# Patient Record
Sex: Male | Born: 2000
Health system: Southern US, Community
[De-identification: ages and names within clinical notes are randomized; demographics above are authoritative.]

## PROBLEM LIST (undated history)

## (undated) DIAGNOSIS — F909 Attention-deficit hyperactivity disorder, unspecified type: Secondary | ICD-10-CM

## (undated) HISTORY — DX: Attention-deficit hyperactivity disorder, unspecified type: F90.9

---
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
## 2020-01-23 ENCOUNTER — Institutional Professional Consult (permissible substitution): Payer: Medicaid Other | Admitting: Family

## 2020-03-03 ENCOUNTER — Telehealth: Payer: Self-pay | Admitting: Family

## 2020-03-03 MED ORDER — SERTRALINE HCL 25 MG PO TABS: ORAL_TABLET | ORAL | 0 refills | Status: DC

## 2020-03-03 NOTE — Telephone Encounter (Signed)
Zoloft 25 mg daily for 7 days then increase to 2 daily for 28 days, # 60 with no RF's.RX for above e-scribed and sent to pharmacy on record  [id number] [place], [place] - [address] AT [facility] [address] [facility] [address] [place] [address] Phone: [phone] Fax: [phone]

## 2020-03-18 ENCOUNTER — Other Ambulatory Visit: Payer: Self-pay

## 2020-03-18 ENCOUNTER — Ambulatory Visit (INDEPENDENT_AMBULATORY_CARE_PROVIDER_SITE_OTHER): Payer: Medicaid Other | Admitting: Family

## 2020-03-18 ENCOUNTER — Encounter: Payer: Self-pay | Admitting: Family

## 2020-03-18 VITALS — BP 108/64 | HR 72 | Resp 16 | Ht 69.09 in | Wt 203.8 lb

## 2020-03-18 DIAGNOSIS — Z7189 Other specified counseling: Secondary | ICD-10-CM

## 2020-03-18 DIAGNOSIS — Z719 Counseling, unspecified: Secondary | ICD-10-CM

## 2020-03-18 DIAGNOSIS — Z79899 Other long term (current) drug therapy: Secondary | ICD-10-CM | POA: Diagnosis not present

## 2020-03-18 DIAGNOSIS — F902 Attention-deficit hyperactivity disorder, combined type: Secondary | ICD-10-CM

## 2020-03-18 DIAGNOSIS — R278 Other lack of coordination: Secondary | ICD-10-CM

## 2020-03-18 NOTE — Progress Notes (Signed)
[facility] [address] [place] [address] Dept: [phone] Dept Fax: [phone] Loc: [phone] Loc Fax: [phone]  Medication Check  Patient ID: Randy Henry, male  DOB: 07-May-2001, 19 y.o.  MRN: [id number]  Date of Evaluation: 03/18/2020 PCP: [name], MD  Accompanied by: Self Patient Lives with: roommate  HISTORY/CURRENT STATUS: HPI Patient here by himself today for the visit. Patient interactive and appropriate with provider today. Patient working full-time and EMT classes in the evening time. Patient had reaction to the COVID-19 vaccine with need for prednisone after 3 weeks. Patient now on 2nd week on Zoloft 25 mg 2 daily with no side effects.   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 :Yes, recent allergic reaction to COVID-19 vaccine. Prednisone for reaction and healing areas with continued issues that remain.   Allergies: Shellfish-derived products  Current Medications: Current Outpatient Medications  Medication Instructions  . Amphetamine Sulfate (EVEKEO) 10-20 mg, Oral, Daily  . EPINEPHrine 0.3 mg/0.3 mL IJ SOAJ injection INJECT INTRAMUSCULARLY AS DIRECTED  . levocetirizine (XYZAL) 5 MG tablet TAKE 1 TABLET PO EVERY EVENING  . sertraline (ZOLOFT) 25 MG tablet Take 1 tablet (25 mg total) by mouth daily for 7 days, THEN 2 tablets (50 mg total) daily for 28 days.  Marland Kitchen triamcinolone ointment  (KENALOG) 0.1 % APP EXT AA BID UNTIL CLEAR THEN PRN  . valACYclovir (VALTREX) 500 mg, Oral, Daily   Medication Side Effects: None  Family Medical/ Social History: Changes? None reported recently.   MENTAL HEALTH: Mental Health Issues: Depression and Anxiety-Zoloft 25 mg now on 2 daily for this week.   PHYSICAL EXAM; Vitals:  Vitals:   03/18/20 1448  BP: 108/64  Pulse: 72  Resp: 16  Height: 5' 9.09" (1.755 m)  Weight: 203 lb 12.8 oz (92.4 kg)  BMI (Calculated): 30.01   General Physical Exam: Unchanged from previous exam, date:Last f/u visit  Changed:none reported today  DIAGNOSES:    ICD-10-CM   1. ADHD (attention deficit hyperactivity disorder), combined type  F90.2   2. Dysgraphia  R27.8   3. Medication management  Z79.899   4. Patient counseled  Z71.9   5. Goals of care, counseling/discussion  Z71.89    RECOMMENDATIONS: Counseling at this visit included the review of old records and/or current chart with the patient with updates for school, work, health and medications.  Discussed recent history and today's examination with patient with no changes on exam today.   Counseled regarding  growth and development with updates reviewed-95 %ile (Z= 1.66) based on CDC (Boys, 2-20 Years) BMI-for-age based on BMI available as of 03/18/2020.  Will continue to monitor.   Recommended a high protein, low sugar diet, watch portion sizes, avoid second helpings, avoid sugary snacks and drinks, drink more water, eat more fruits and vegetables, increase daily exercise.  Discussed school academic and behavioral progress and advocated for appropriate accommodations as needed for any academics with support.   Discussed importance of maintaining structure, routine, organization, reward, motivation and consequences with consistency  Counseled medication pharmacokinetics, options, dosage, administration,  desired effects, and possible side effects.    Advised importance of:  Good sleep hygiene  (8- 10 hours per night, no TV or video games for 1 hour before bedtime) Limited screen time (none on school nights, no more than 2 hours/day on weekends, use of screen time for motivation) Regular exercise(outside and active play) Healthy eating (drink water or milk, no sodas/sweet tea, limit portions and no seconds).   NEXT APPOINTMENT: Return in about 3 months (around 06/17/2020) for f/u visit.  Medical Decision-making: More than 50% of the appointment was spent counseling and discussing diagnosis and management of symptoms with the patient and family.  Carron Curie, NP Counseling Time: 25 mins  Total Contact Time: 30 mins

## 2020-04-03 IMAGING — CT CT HEAD W/O CM
3 series · 14 of 47 positions shown, 16 images · non-contrast
Comparison: None.

CLINICAL DATA: Facial trauma, concern for broken nose, injury while
playing basketball

EXAM:
CT HEAD WITHOUT CONTRAST
CT MAXILLOFACIAL WITHOUT CONTRAST
CT CERVICAL SPINE WITHOUT CONTRAST
TECHNIQUE: Multidetector CT imaging of the head, cervical spine, and
maxillofacial structures were performed using the standard protocol
without intravenous contrast. Multiplanar CT image reconstructions
of the cervical spine and maxillofacial structures were also
generated.

[Series 2: head wo · axial · 0.44mm/px · z∈[-136,-11]mm · 8 of 30 slices shown, 10 images]
[im 3/30  brain]
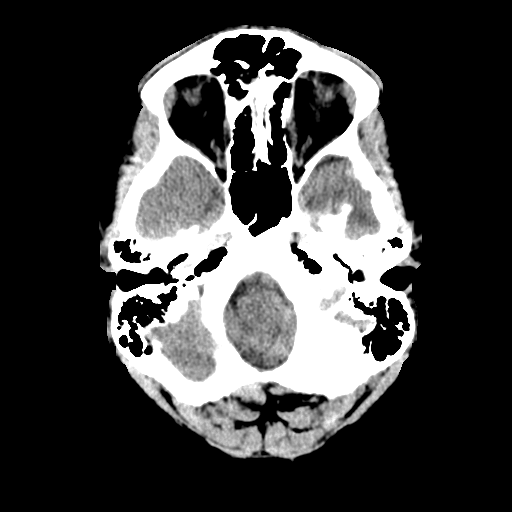
[im 3/30  bone]
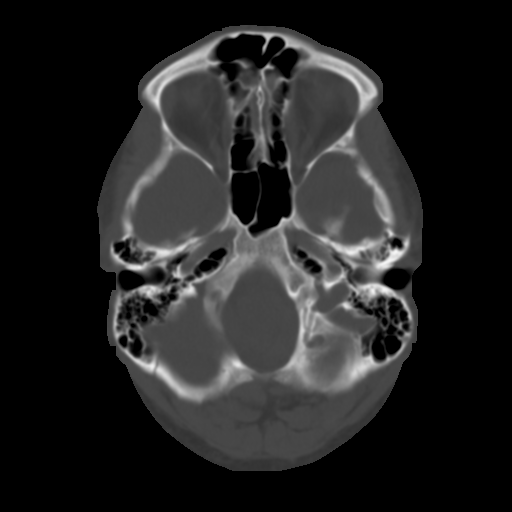
[im 7/30  brain]
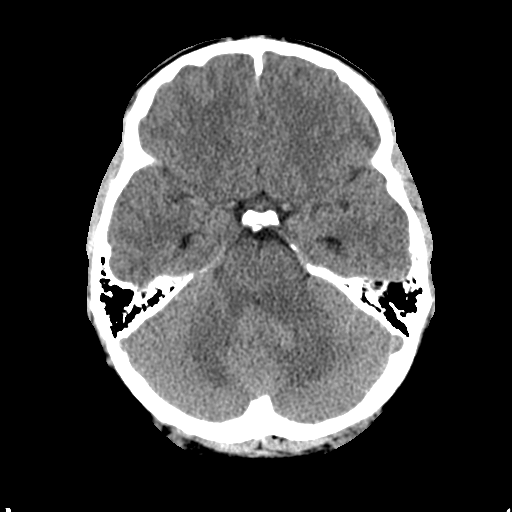
[im 10/30  brain]
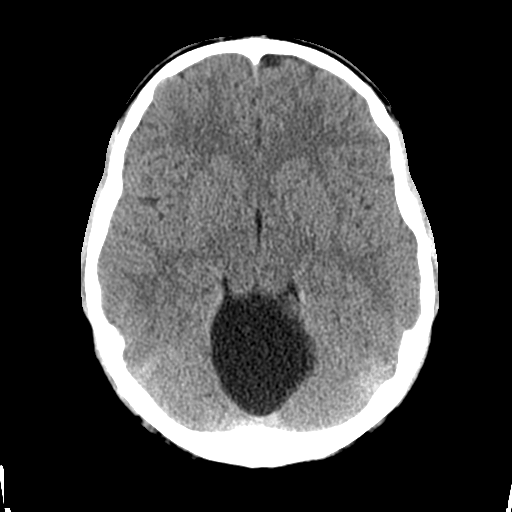
[im 14/30  brain]
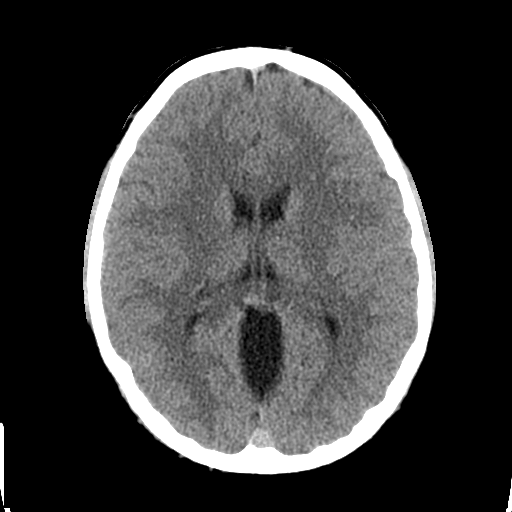
[im 17/30  brain]
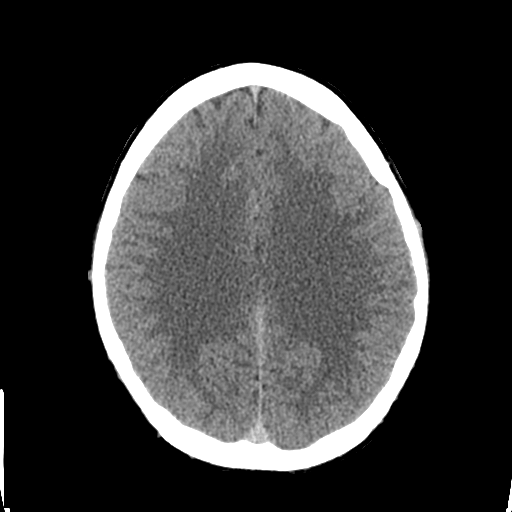
[im 17/30  bone]
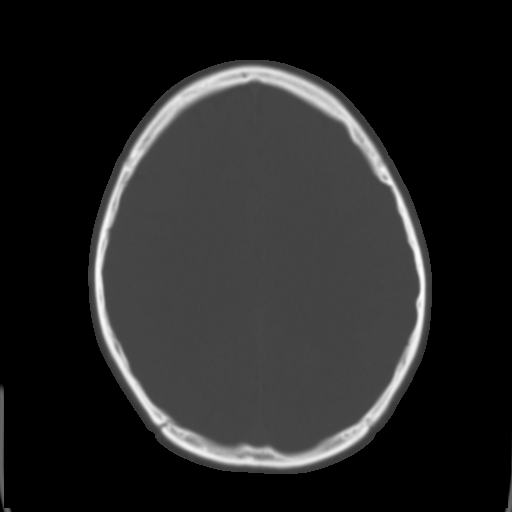
[im 21/30  brain]
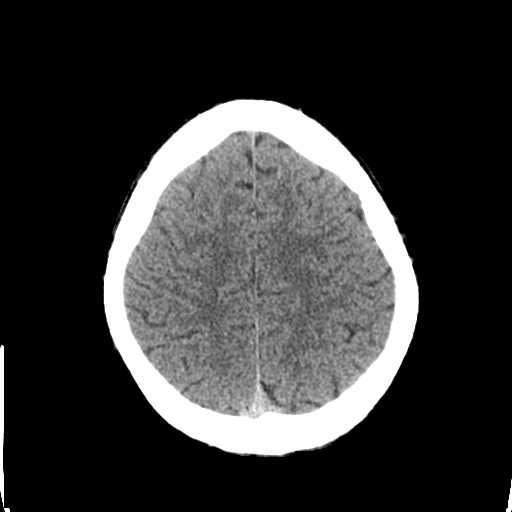
[im 24/30  brain]
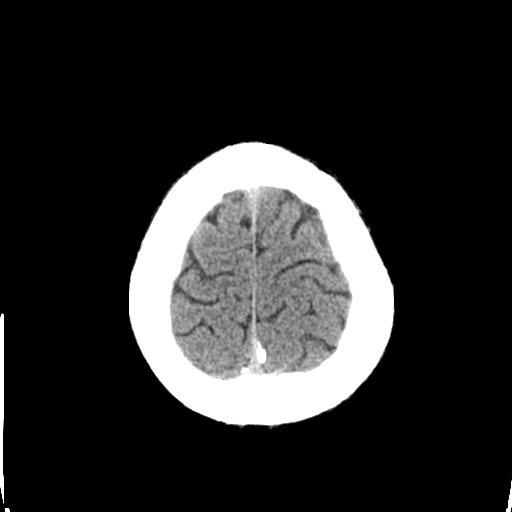
[im 28/30  brain]
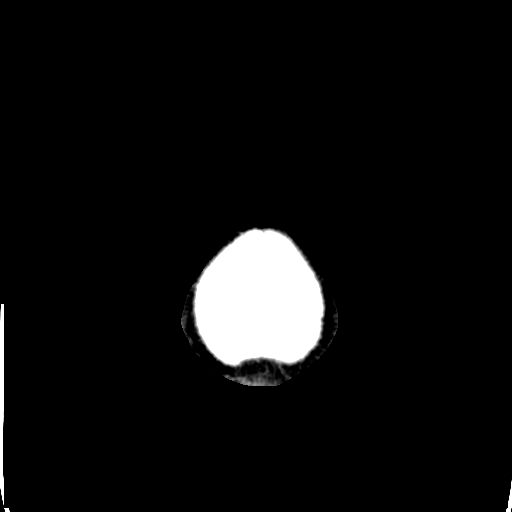

[Series 4: coronal soft tissue · coronal · 0.31mm/px · 3 of 66 slices shown]
[im 22/66  brain]
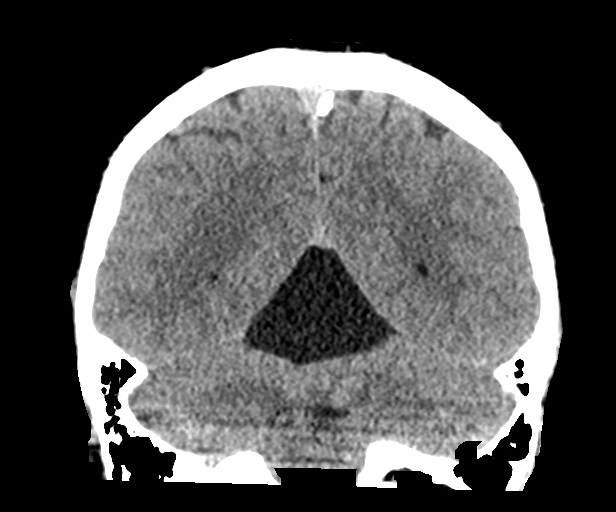
[im 29/66  brain]
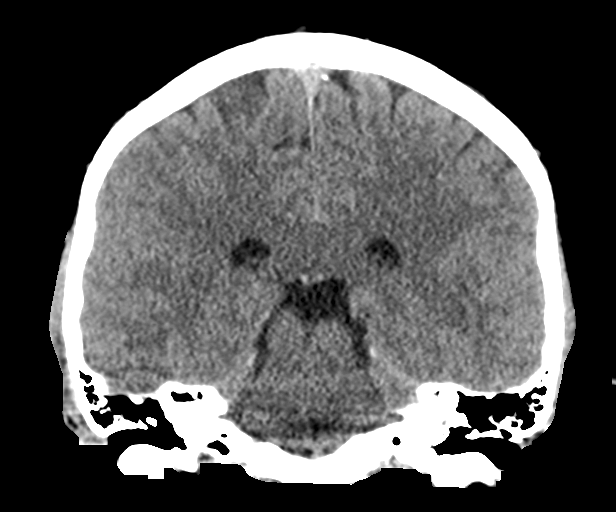
[im 37/66  brain]
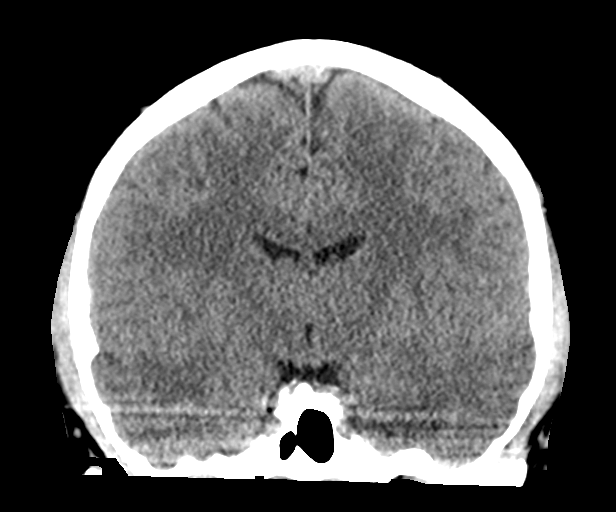

[Series 5: sagittal soft tissue · sagittal · 0.33mm/px · 3 of 58 slices shown]
[im 20/58  brain]
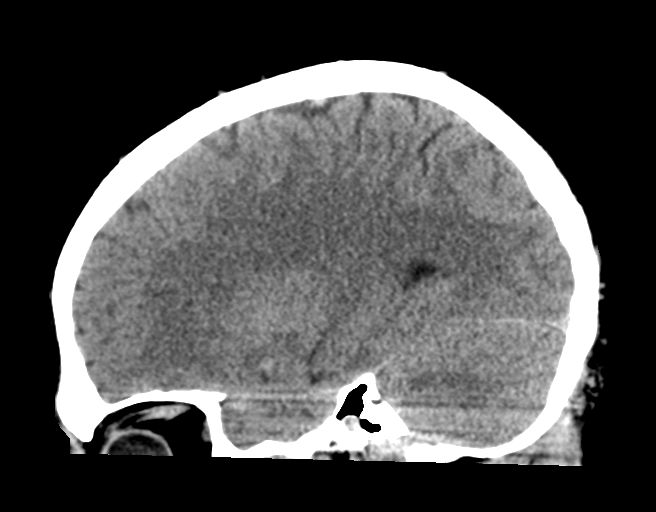
[im 29/58  brain]
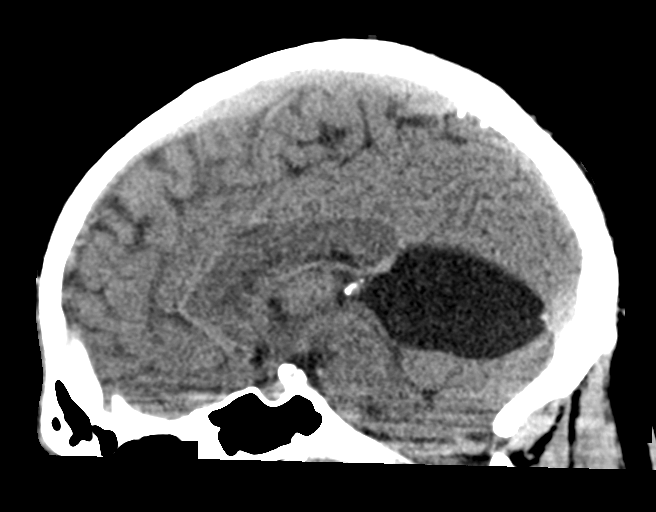
[im 39/58  brain]
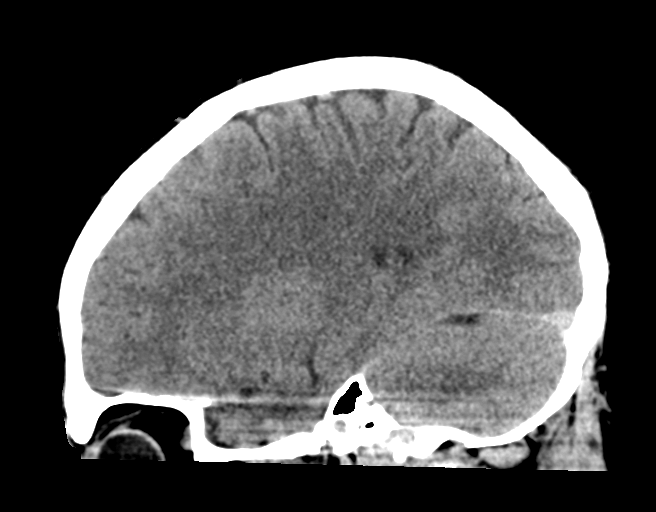

[14 of 47 positions shown; findings below may reference images not displayed]

FINDINGS: CT HEAD FINDINGS

Brain: No evidence of acute infarction, hemorrhage, or
hydrocephalus. Expansion of the CSF filled space below the tentorium
to the cerebellum which appears to abut the third ventricle
anteriorly and displace the cerebellum inferiorly. No other
extra-axial collection or mass lesion/mass effect.

Vascular: No hyperdense vessel or unexpected calcification.

Skull: No calvarial fracture or suspicious osseous lesion. No scalp
swelling or hematoma.

Other: None

CT MAXILLOFACIAL FINDINGS

Osseous: Minimally displaced bilateral nasal bone fractures as well
as a mildly displaced fracture extending into the anterior process
of the right maxilla. Superficial soft tissue swelling across the
nasal bridge is noted. No other acute fractures the bony midface. No
fracture of the bony orbits. The pterygoid plates are intact. The
mandible is intact. Temporomandibular joints are normally aligned.
No temporal bone fractures are identified. No fractured or avulsed
teeth.

Orbits: No retro septal gas, stranding or hemorrhage. Included
orbital structures are unremarkable.

Sinuses: Minimal mucosal thickening in the inferior right maxillary
sinus. No air-fluid levels. Pneumatization of the petrous apices.
Middle ear cavities are clear. Ossicular chains are normally
configured.

Soft tissues: Soft tissue swelling across the bridge of the nose
superficial to the nasal bone fractures detailed above.

CT CERVICAL SPINE FINDINGS

Alignment: Stabilization collar is not visualized at the time of
exam. Straightening of the cervical lordosis, likely positional,
without traumatic listhesis. No abnormal facet widening. Mild
positional rightward tilt of the cranium with otherwise normal
alignment of the craniocervical and atlantoaxial articulations.

Skull base and vertebrae: No acute fracture. No primary bone lesion
or focal pathologic process.

Soft tissues and spinal canal: No pre or paravertebral fluid or
swelling. No visible canal hematoma.

Disc levels: No significant central canal or foraminal stenosis
identified within the imaged levels of the spine.

Upper chest: No acute abnormality in the upper chest or imaged lung
apices.

Other: Normal thyroid.
IMPRESSION: CT HEAD:

1. No acute intracranial process.
2. Expansion of the CSF filled space below the tentorium to the
cerebellum which appears to abut the third ventricle anteriorly and
displace the cerebellum inferiorly. This likely represents an
arachnoid cyst.

CT MAXILLOFACIAL:

1. Minimally displaced bilateral nasal bone fractures as well as a
mildly displaced fracture extending into the anterior process of the
right maxilla. Overlying soft tissue swelling.
2. No other acute bony abnormality.

CT CERVICAL SPINE:

1. No acute fracture is identified in the cervical spine.

## 2020-04-03 IMAGING — CT CT MAXILLOFACIAL W/O CM
3 series · 14 of 47 positions shown, 16 images · non-contrast
Comparison: None.

CLINICAL DATA: Facial trauma, concern for broken nose, injury while
playing basketball

EXAM:
CT HEAD WITHOUT CONTRAST
CT MAXILLOFACIAL WITHOUT CONTRAST
CT CERVICAL SPINE WITHOUT CONTRAST
TECHNIQUE: Multidetector CT imaging of the head, cervical spine, and
maxillofacial structures were performed using the standard protocol
without intravenous contrast. Multiplanar CT image reconstructions
of the cervical spine and maxillofacial structures were also
generated.

[Series 2: max soft · axial · 0.34mm/px · z∈[-264,-126]mm · 8 of 81 slices shown, 10 images]
[im 6/81  brain]
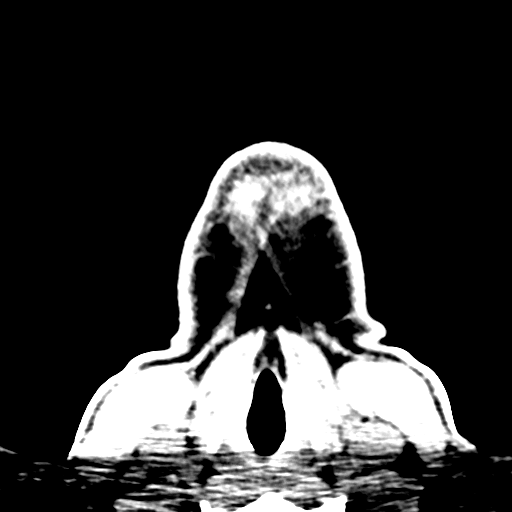
[im 6/81  bone]
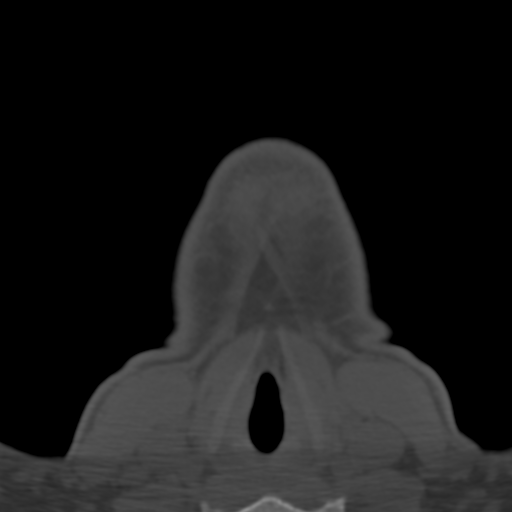
[im 17/81  bone]
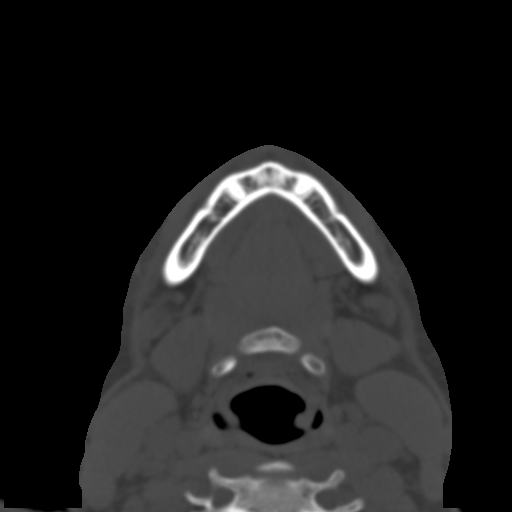
[im 25/81  bone]
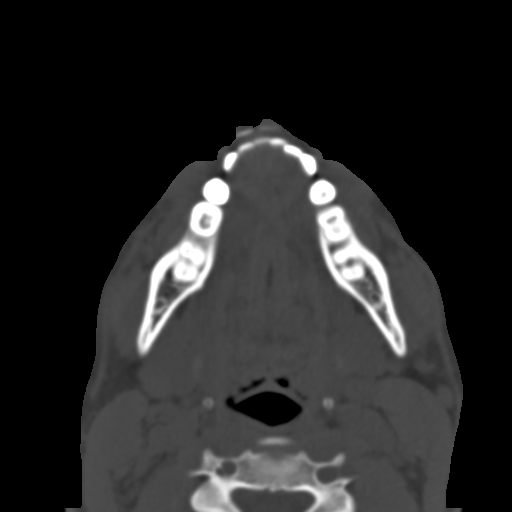
[im 36/81  bone]
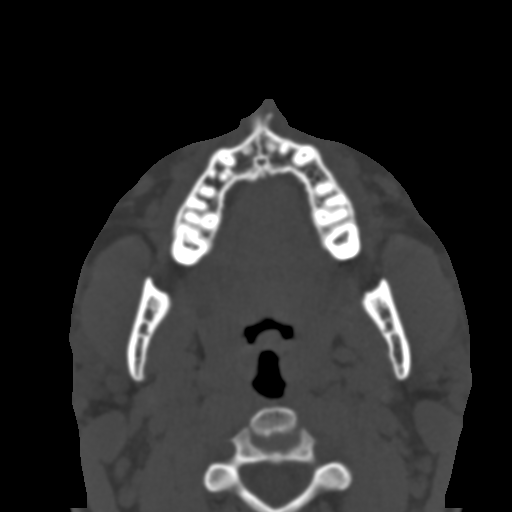
[im 45/81  brain]
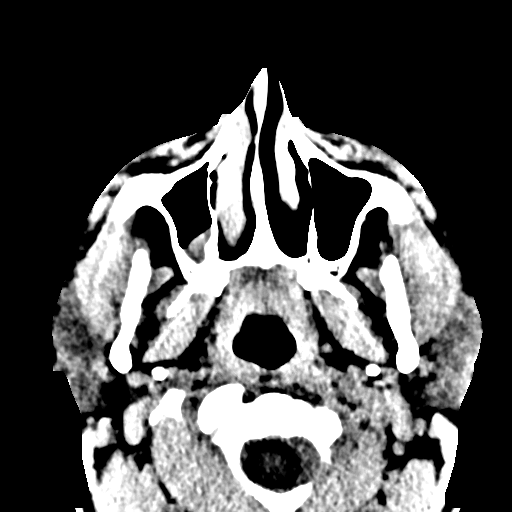
[im 45/81  bone]
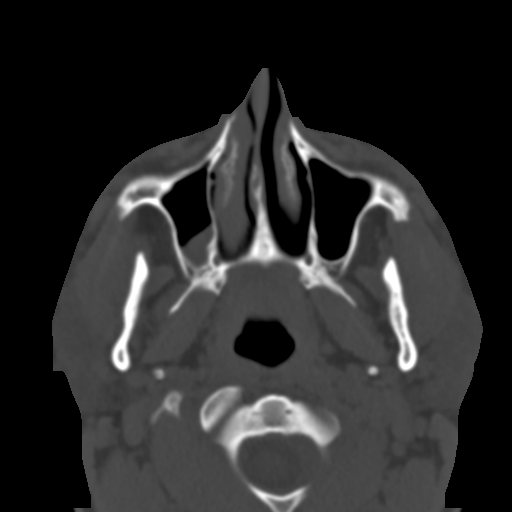
[im 56/81  bone]
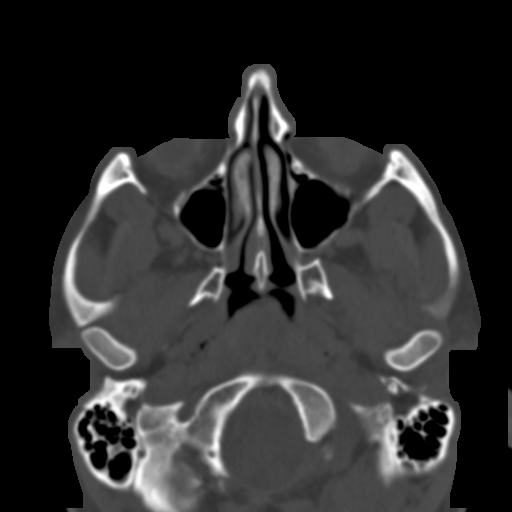
[im 64/81  bone]
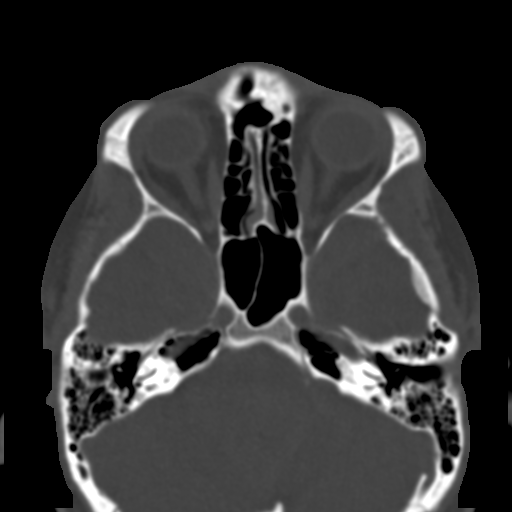
[im 75/81  bone]
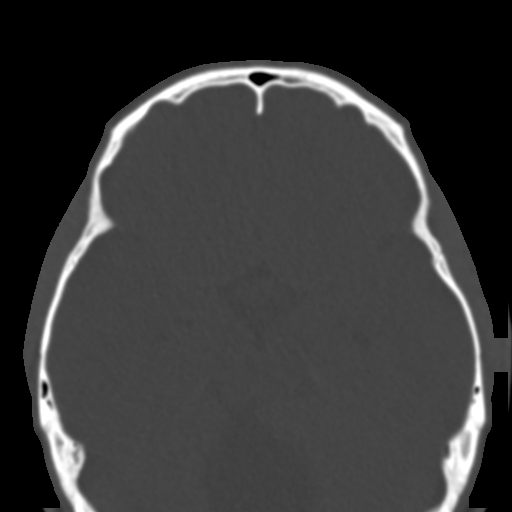

[Series 6: coronal soft · coronal · 0.38mm/px · 3 of 81 slices shown]
[im 27/81  bone]
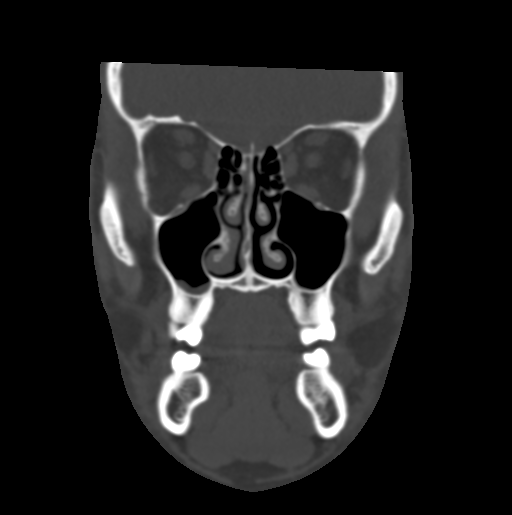
[im 36/81  bone]
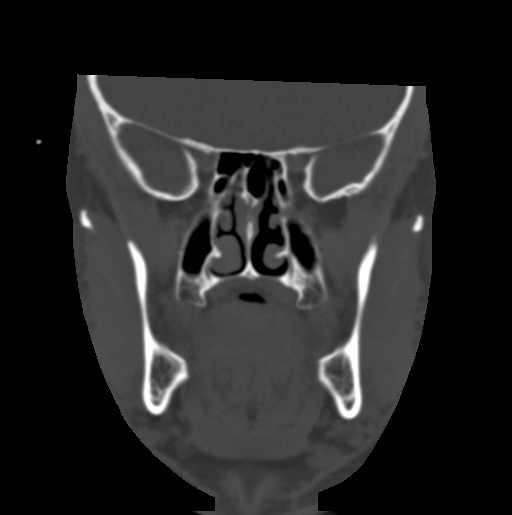
[im 45/81  bone]
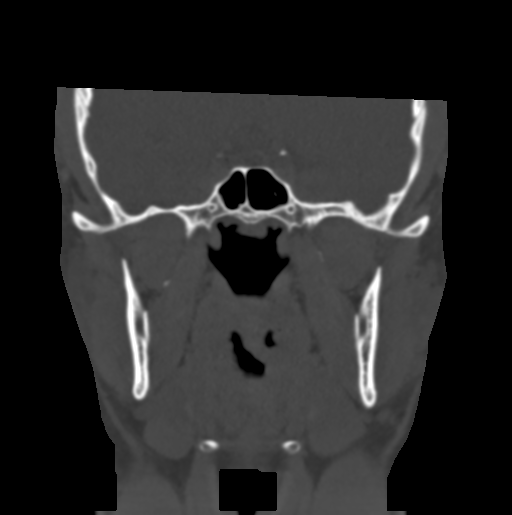

[Series 7: sagittal soft · sagittal · 0.36mm/px · 3 of 79 slices shown]
[im 27/79  bone]
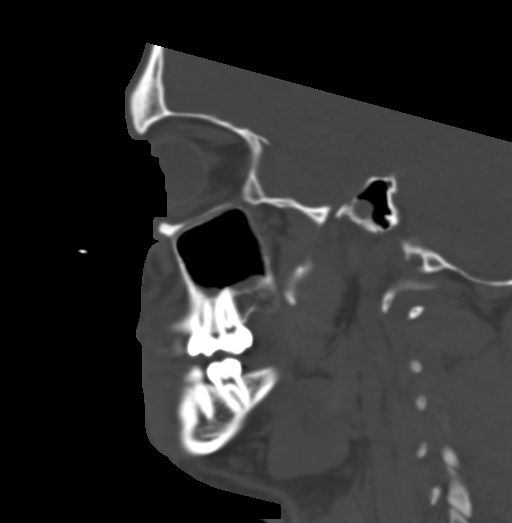
[im 40/79  bone]
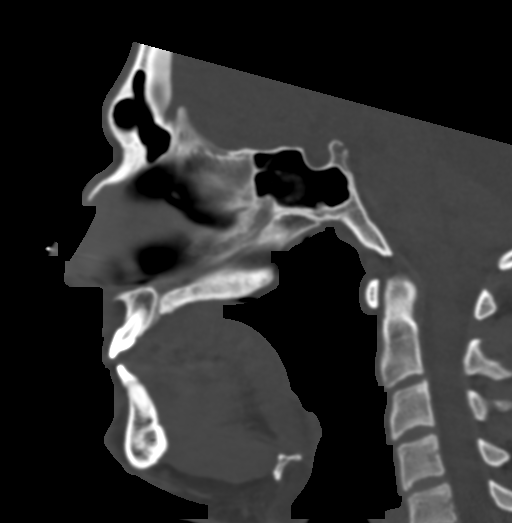
[im 53/79  bone]
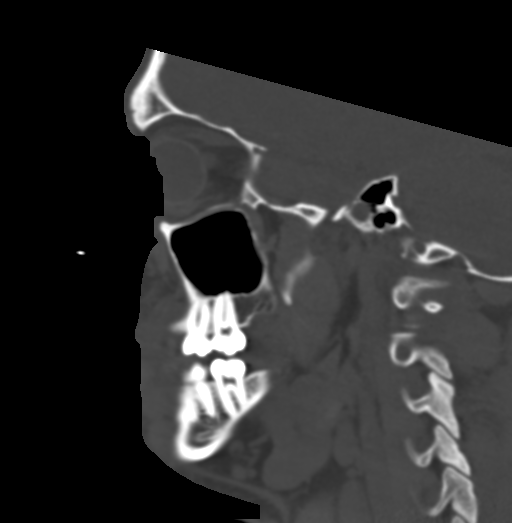

[14 of 47 positions shown; findings below may reference images not displayed]

FINDINGS: CT HEAD FINDINGS

Brain: No evidence of acute infarction, hemorrhage, or
hydrocephalus. Expansion of the CSF filled space below the tentorium
to the cerebellum which appears to abut the third ventricle
anteriorly and displace the cerebellum inferiorly. No other
extra-axial collection or mass lesion/mass effect.

Vascular: No hyperdense vessel or unexpected calcification.

Skull: No calvarial fracture or suspicious osseous lesion. No scalp
swelling or hematoma.

Other: None

CT MAXILLOFACIAL FINDINGS

Osseous: Minimally displaced bilateral nasal bone fractures as well
as a mildly displaced fracture extending into the anterior process
of the right maxilla. Superficial soft tissue swelling across the
nasal bridge is noted. No other acute fractures the bony midface. No
fracture of the bony orbits. The pterygoid plates are intact. The
mandible is intact. Temporomandibular joints are normally aligned.
No temporal bone fractures are identified. No fractured or avulsed
teeth.

Orbits: No retro septal gas, stranding or hemorrhage. Included
orbital structures are unremarkable.

Sinuses: Minimal mucosal thickening in the inferior right maxillary
sinus. No air-fluid levels. Pneumatization of the petrous apices.
Middle ear cavities are clear. Ossicular chains are normally
configured.

Soft tissues: Soft tissue swelling across the bridge of the nose
superficial to the nasal bone fractures detailed above.

CT CERVICAL SPINE FINDINGS

Alignment: Stabilization collar is not visualized at the time of
exam. Straightening of the cervical lordosis, likely positional,
without traumatic listhesis. No abnormal facet widening. Mild
positional rightward tilt of the cranium with otherwise normal
alignment of the craniocervical and atlantoaxial articulations.

Skull base and vertebrae: No acute fracture. No primary bone lesion
or focal pathologic process.

Soft tissues and spinal canal: No pre or paravertebral fluid or
swelling. No visible canal hematoma.

Disc levels: No significant central canal or foraminal stenosis
identified within the imaged levels of the spine.

Upper chest: No acute abnormality in the upper chest or imaged lung
apices.

Other: Normal thyroid.
IMPRESSION: CT HEAD:

1. No acute intracranial process.
2. Expansion of the CSF filled space below the tentorium to the
cerebellum which appears to abut the third ventricle anteriorly and
displace the cerebellum inferiorly. This likely represents an
arachnoid cyst.

CT MAXILLOFACIAL:

1. Minimally displaced bilateral nasal bone fractures as well as a
mildly displaced fracture extending into the anterior process of the
right maxilla. Overlying soft tissue swelling.
2. No other acute bony abnormality.

CT CERVICAL SPINE:

1. No acute fracture is identified in the cervical spine.

## 2020-04-11 ENCOUNTER — Other Ambulatory Visit: Payer: Self-pay | Admitting: Family

## 2020-04-13 NOTE — Telephone Encounter (Signed)
Last visit 03/18/2020 next visit 06/24/2020

## 2020-04-13 NOTE — Telephone Encounter (Signed)
E-Prescribed sertraline 25 mg BID directly to  [id number] [place], [place] - [address] AT [facility] [address] [facility] [address] [place] [address] Phone: [phone] Fax: [phone]

## 2020-06-24 ENCOUNTER — Telehealth (INDEPENDENT_AMBULATORY_CARE_PROVIDER_SITE_OTHER): Payer: Medicaid Other | Admitting: Family

## 2020-06-24 ENCOUNTER — Other Ambulatory Visit: Payer: Self-pay

## 2020-06-24 DIAGNOSIS — F902 Attention-deficit hyperactivity disorder, combined type: Secondary | ICD-10-CM

## 2020-06-24 DIAGNOSIS — Z719 Counseling, unspecified: Secondary | ICD-10-CM | POA: Diagnosis not present

## 2020-06-24 DIAGNOSIS — F411 Generalized anxiety disorder: Secondary | ICD-10-CM

## 2020-06-24 DIAGNOSIS — Z79899 Other long term (current) drug therapy: Secondary | ICD-10-CM | POA: Diagnosis not present

## 2020-06-24 DIAGNOSIS — R278 Other lack of coordination: Secondary | ICD-10-CM | POA: Diagnosis not present

## 2020-06-24 DIAGNOSIS — Z7189 Other specified counseling: Secondary | ICD-10-CM

## 2020-06-24 DIAGNOSIS — Z8659 Personal history of other mental and behavioral disorders: Secondary | ICD-10-CM

## 2020-06-25 ENCOUNTER — Encounter: Payer: Self-pay | Admitting: Family

## 2020-06-25 NOTE — Progress Notes (Signed)
[facility] [address], [place]. [address] [place] [address] Dept: [phone] Dept Fax: [phone]  Medication Check visit via Virtual Video   Patient ID:  Randy Henry  male DOB: 10/01/2000   19 y.o.   MRN: [id number]   DATE:06/25/20  PCP: [name], MD  Virtual Visit via Video Note  I connected with  Harriet Masson on 06/25/20 at  2:00 PM EST by a video enabled telemedicine application and verified that I am speaking with the correct person using two identifiers. Patient/Parent Location: at work   I discussed the limitations, risks, security and privacy concerns of performing an evaluation and management service by telephone and the availability of in person appointments. I also discussed with the parents that there may be a patient responsible charge related to this service. The parents expressed understanding and agreed to proceed.  Provider: Carron Curie, NP  Location: work location  HISTORY/CURRENT STATUS: Randy Henry is here for medication management of the psychoactive medications for ADHD and review of educational and behavioral concerns.   Brennan currently taking Zoloft and Evekeo, which is working well. Takes medication as directed daily. Medication tends to wear off around evening time for his Evekeo. Irwin is able to focus through work for the day.   Nosson is eating well (eating breakfast, lunch and dinner). No recent reported changes in eating habits.   Sleeping well (goes to bed at 10-11:00 pm wakes at 6:00 am), sleeping through the night. No changes reported by patient recently.   EDUCATION/WORK: WORK: [employer] Hours: 7:30-3:30 pm most days Monday through Friday Activities/ Exercise: daily-with work duties.  Screen time: (phone, tablet, TV, computer): TV, phone, movies, and games in the evening time.   MEDICAL HISTORY: Individual Medical History/  Review of Systems: Changes? :Yes, had recent exposure to   [facility]/ Social History: Changes? None Patient Lives with:self  Current Medications:  Current Outpatient Medications  Medication Instructions  . Amphetamine Sulfate (EVEKEO) 10-20 mg, Oral, Daily  . EPINEPHrine 0.3 mg/0.3 mL IJ SOAJ injection INJECT INTRAMUSCULARLY AS DIRECTED  . levocetirizine (XYZAL) 5 MG tablet TAKE 1 TABLET PO EVERY EVENING  . sertraline (ZOLOFT) 50 mg, Oral, Daily  . triamcinolone ointment (KENALOG) 0.1 % APP EXT AA BID UNTIL CLEAR THEN PRN  . valACYclovir (VALTREX) 500 mg, Oral, Daily   Medication Side Effects: None  MENTAL HEALTH: Mental Health Issues:   Depression and Anxiety-Zoloft 25 mg 2 daily, good symptom control   DIAGNOSES:    ICD-10-CM   1. ADHD (attention deficit hyperactivity disorder), combined type  F90.2   2. Dysgraphia  R27.8   3. Medication management  Z79.899   4. Patient counseled  Z71.9   5. Generalized anxiety disorder  F41.1   6. History of depression  Z86.59   7. Goals of care, counseling/discussion  Z71.89     RECOMMENDATIONS:  Discussed recent history with patient with updates for work, schedule, change in hours, health and medications.   Counseled on relationship with mother and patient with recent positive changes.   Discussed work progress with change in job recently.  Discussed recent health concerns and any changes since last f/u visit with specialists. Recommended healthy food choices, watching portion sizes, avoiding second helpings, avoiding sugary drinks like soda and tea, drinking more water, getting more exercise.   Discussed continued need for structure, routine, reward (external), motivation (internal), positive reinforcement, consequences, and organization with work  and home life along with social interactions.   Encouraged recommended limitations on TV, tablets, phones, video games and computers for non-educational activities.   Discussed need  for bedtime routine, use of good sleep hygiene, no video games, TV or phones for an hour before bedtime.   Encouraged physical activity and outdoor play, maintaining social distancing.   Counseled medication pharmacokinetics, options, dosage, administration, desired effects, and possible side effects.   Evekeo 10 mg 1-2 tablets daily no Rx today Zoloft 25 mg 2 daily, no Rx today   I discussed the assessment and treatment plan with the patient. The patient was provided an opportunity to ask questions and all were answered. The patient agreed with the plan and demonstrated an understanding of the instructions.   I provided 25 minutes of non-face-to-face time during this encounter.   Completed record review for 10 minutes prior to the virtual video visit.   NEXT APPOINTMENT:  Return in about 3 months (around 09/22/2020) for f/u visit.  The patient was advised to call back or seek an in-person evaluation if the symptoms worsen or if the condition fails to improve as anticipated.  Medical Decision-making: More than 50% of the appointment was spent counseling and discussing diagnosis and management of symptoms with the patient and family.  Carron Curie, NP

## 2020-07-08 ENCOUNTER — Ambulatory Visit
Admission: EM | Admit: 2020-07-08 | Discharge: 2020-07-08 | Disposition: A | Discharge status: Home / Self Care | Payer: Medicaid Other | Attending: Family Medicine | Admitting: Family Medicine

## 2020-07-08 ENCOUNTER — Other Ambulatory Visit: Payer: Self-pay

## 2020-07-08 DIAGNOSIS — R509 Fever, unspecified: Secondary | ICD-10-CM

## 2020-07-08 NOTE — ED Provider Notes (Signed)
Renaldo Fiddler    CSN: [id number] Arrival date & time: 07/08/20  1051      History   Chief Complaint Chief Complaint  Patient presents with  . Generalized Body Aches    HPI RALPHIE LOVELADY is a 20 y.o. male.   Patient is a 20 year old male presents today with generalized body aches, fever, loss of taste and smell x2 days.  Symptoms have been constant.  Has not taken anything for his symptoms.  Possible COVID exposure.     Past Medical History:  Diagnosis Date  . ADHD (attention deficit hyperactivity disorder)     Patient Active Problem List   Diagnosis Date Noted  . Dysgraphia 03/28/2019  . Medication management 03/28/2019  . Patient counseled 03/28/2019  . ADHD (attention deficit hyperactivity disorder), combined type 12/07/2015    History reviewed. No pertinent surgical history.     Home Medications    Prior to Admission medications   Medication Sig Start Date End Date Taking? Authorizing Provider  Amphetamine Sulfate (EVEKEO) 10 MG TABS Take 10-20 mg by mouth daily. 12/11/19   Paretta-Leahey, Miachel Roux, NP  sertraline (ZOLOFT) 25 MG tablet Take 2 tablets (50 mg total) by mouth daily. 04/13/20   Lorina Rabon, NP  triamcinolone ointment (KENALOG) 0.1 % APP EXT AA BID UNTIL CLEAR THEN PRN 02/13/18   [provider]  valACYclovir (VALTREX) 500 MG tablet Take 500 mg by mouth daily. 11/10/16   [provider]  levocetirizine (XYZAL) 5 MG tablet TAKE 1 TABLET PO EVERY EVENING Patient not taking: No sig reported 11/21/17 07/08/20  [provider]    Family History Family History  Problem Relation Age of Onset  . ADD / ADHD Mother   . Cancer Maternal Grandfather     Social History Social History   Tobacco Use  . Smoking status: Passive Smoke Exposure - Never Smoker  . Smokeless tobacco: Never Used  Substance Use Topics  . Alcohol use: No     Allergies   Shellfish-derived products   Review of Systems Review of  Systems   Physical Exam Triage Vital Signs ED Triage Vitals  Enc Vitals Group     BP 07/08/20 1111 117/75     Pulse Rate 07/08/20 1111 (!) 102     Resp 07/08/20 1111 19     Temp 07/08/20 1111 98.5 F (36.9 C)     Temp Source 07/08/20 1111 Oral     SpO2 07/08/20 1111 96 %     Weight --      Height --      Head Circumference --      Peak Flow --      Pain Score 07/08/20 1115 0     Pain Loc --      Pain Edu? --      Excl. in GC? --    No data found.  Updated Vital Signs BP 117/75 (BP Location: Left Arm)   Pulse (!) 102   Temp 98.5 F (36.9 C) (Oral)   Resp 19   SpO2 96%   Visual Acuity Right Eye Distance:   Left Eye Distance:   Bilateral Distance:    Right Eye Near:   Left Eye Near:    Bilateral Near:     Physical Exam Vitals and nursing note reviewed.  Constitutional:      Appearance: Normal appearance.  HENT:     Head: Normocephalic and atraumatic.  Eyes:     Conjunctiva/sclera: Conjunctivae normal.  Pulmonary:  Effort: Pulmonary effort is normal.  Musculoskeletal:        General: Normal range of motion.     Cervical back: Normal range of motion.  Skin:    General: Skin is warm and dry.  Neurological:     Mental Status: He is alert.  Psychiatric:        Mood and Affect: Mood normal.      UC Treatments / Results  Labs (all labs ordered are listed, but only abnormal results are displayed) Labs Reviewed  COVID-19, FLU A+B NAA    EKG   Radiology No results found.  Procedures Procedures (including critical care time)  Medications Ordered in UC Medications - No data to display  Initial Impression / Assessment and Plan / UC Course  I have reviewed the triage vital signs and the nursing notes.  Pertinent labs & imaging results that were available during my care of the patient were reviewed by me and considered in my medical decision making (see chart for details).     Fever Concern for COVID.  COVID test pending. Recommend  over-the-counter medicines for symptoms as needed. Rest, hydrate  Final Clinical Impressions(s) / UC Diagnoses   Final diagnoses:  Fever, unspecified     Discharge Instructions     Covid test pending.  You can take OTC medicines as needed Tylenol, motrin, mucinex and cold and flu medicines are good.  Rest, hydrate.  Follow up as needed for continued or worsening symptoms     ED Prescriptions    None     PDMP not reviewed this encounter.   Janace Aris, NP 07/08/20 1127

## 2020-07-08 NOTE — Discharge Instructions (Addendum)
Covid test pending.  You can take OTC medicines as needed Tylenol, motrin, mucinex and cold and flu medicines are good.  Rest, hydrate.  Follow up as needed for continued or worsening symptoms

## 2020-07-08 NOTE — ED Triage Notes (Signed)
Pt presents with complaints of body aches, fever, and loss of taste and smell x 2 days. Requesting covid test. Denies any other symptoms.

## 2020-07-14 LAB — COVID-19, FLU A+B NAA
Influenza A, NAA: NOT DETECTED
Influenza B, NAA: NOT DETECTED
SARS-CoV-2, NAA: DETECTED — AB

## 2020-08-10 ENCOUNTER — Other Ambulatory Visit: Payer: Self-pay | Admitting: Pediatrics

## 2020-08-10 NOTE — Telephone Encounter (Signed)
E-Prescribed sertraline 25 mg BID directly to  [id number] [place], [place] - [address] AT [facility] [address] [facility] [address] [place] [address] Phone: [phone] Fax: [phone]  Next appt. : Visit date not found

## 2021-03-11 ENCOUNTER — Other Ambulatory Visit: Payer: Self-pay

## 2021-03-11 ENCOUNTER — Inpatient Hospital Stay
Admission: EM | Admit: 2021-03-11 | Discharge: 2021-03-25 | DRG: 872 | Disposition: A | Discharge status: Home / Self Care | Payer: Medicaid Other | Attending: Family Medicine | Admitting: Family Medicine

## 2021-03-11 DIAGNOSIS — A4101 Sepsis due to Methicillin susceptible Staphylococcus aureus: Secondary | ICD-10-CM | POA: Diagnosis present

## 2021-03-11 DIAGNOSIS — J45909 Unspecified asthma, uncomplicated: Secondary | ICD-10-CM | POA: Diagnosis present

## 2021-03-11 DIAGNOSIS — A419 Sepsis, unspecified organism: Secondary | ICD-10-CM | POA: Diagnosis not present

## 2021-03-11 DIAGNOSIS — F39 Unspecified mood [affective] disorder: Secondary | ICD-10-CM | POA: Diagnosis present

## 2021-03-11 DIAGNOSIS — R6520 Severe sepsis without septic shock: Secondary | ICD-10-CM

## 2021-03-11 DIAGNOSIS — L209 Atopic dermatitis, unspecified: Secondary | ICD-10-CM | POA: Diagnosis present

## 2021-03-11 DIAGNOSIS — L0390 Cellulitis, unspecified: Secondary | ICD-10-CM | POA: Diagnosis not present

## 2021-03-11 DIAGNOSIS — Z8616 Personal history of COVID-19: Secondary | ICD-10-CM | POA: Diagnosis not present

## 2021-03-11 DIAGNOSIS — Z9114 Patient's other noncompliance with medication regimen: Secondary | ICD-10-CM

## 2021-03-11 DIAGNOSIS — R21 Rash and other nonspecific skin eruption: Secondary | ICD-10-CM | POA: Diagnosis present

## 2021-03-11 DIAGNOSIS — I471 Supraventricular tachycardia: Secondary | ICD-10-CM | POA: Diagnosis not present

## 2021-03-11 DIAGNOSIS — E876 Hypokalemia: Secondary | ICD-10-CM | POA: Diagnosis present

## 2021-03-11 DIAGNOSIS — Z7722 Contact with and (suspected) exposure to environmental tobacco smoke (acute) (chronic): Secondary | ICD-10-CM | POA: Diagnosis present

## 2021-03-11 DIAGNOSIS — B9561 Methicillin susceptible Staphylococcus aureus infection as the cause of diseases classified elsewhere: Secondary | ICD-10-CM | POA: Diagnosis present

## 2021-03-11 DIAGNOSIS — F1990 Other psychoactive substance use, unspecified, uncomplicated: Secondary | ICD-10-CM | POA: Diagnosis present

## 2021-03-11 DIAGNOSIS — Z79899 Other long term (current) drug therapy: Secondary | ICD-10-CM

## 2021-03-11 DIAGNOSIS — R7881 Bacteremia: Secondary | ICD-10-CM | POA: Diagnosis present

## 2021-03-11 DIAGNOSIS — Z818 Family history of other mental and behavioral disorders: Secondary | ICD-10-CM

## 2021-03-11 DIAGNOSIS — F902 Attention-deficit hyperactivity disorder, combined type: Secondary | ICD-10-CM | POA: Diagnosis present

## 2021-03-11 DIAGNOSIS — Z91013 Allergy to seafood: Secondary | ICD-10-CM

## 2021-03-11 LAB — COMPREHENSIVE METABOLIC PANEL
ALT: 52 U/L — ABNORMAL HIGH (ref 0–44)
AST: 40 U/L (ref 15–41)
Albumin: 4.7 g/dL (ref 3.5–5.0)
Alkaline Phosphatase: 80 U/L (ref 38–126)
Anion gap: 15 (ref 5–15)
BUN: 13 mg/dL (ref 6–20)
CO2: 21 mmol/L — ABNORMAL LOW (ref 22–32)
Calcium: 9.6 mg/dL (ref 8.9–10.3)
Chloride: 99 mmol/L (ref 98–111)
Creatinine, Ser: 1.06 mg/dL (ref 0.61–1.24)
GFR, Estimated: >60 mL/min (ref >60)
Glucose, Bld: 167 mg/dL — ABNORMAL HIGH (ref 70–99)
Potassium: 3.4 mmol/L — ABNORMAL LOW (ref 3.5–5.1)
Sodium: 135 mmol/L (ref 135–145)
Total Bilirubin: 0.9 mg/dL (ref 0.3–1.2)
Total Protein: 7.8 g/dL (ref 6.5–8.1)

## 2021-03-11 LAB — LACTIC ACID, PLASMA
Lactic Acid, Venous: 5.3 mmol/L (ref 0.5–1.9)
Lactic Acid, Venous: 3.6 mmol/L (ref 0.5–1.9)

## 2021-03-11 LAB — CBC WITH DIFFERENTIAL/PLATELET
Abs Immature Granulocytes: 0.07 10*3/uL (ref 0.00–0.07)
Basophils Absolute: 0.1 10*3/uL (ref 0.0–0.1)
Basophils Relative: 0 %
Eosinophils Absolute: 0.3 10*3/uL (ref 0.0–0.5)
Eosinophils Relative: 2 %
HCT: 44.8 % (ref 39.0–52.0)
Hemoglobin: 16.6 g/dL (ref 13.0–17.0)
Immature Granulocytes: 0 %
Lymphocytes Relative: 6 %
Lymphs Abs: 1.2 10*3/uL (ref 0.7–4.0)
MCH: 31.4 pg (ref 26.0–34.0)
MCHC: 37.1 g/dL — ABNORMAL HIGH (ref 30.0–36.0)
MCV: 84.7 fL (ref 80.0–100.0)
Monocytes Absolute: 1 10*3/uL (ref 0.1–1.0)
Monocytes Relative: 5 %
Neutro Abs: 17 10*3/uL — ABNORMAL HIGH (ref 1.7–7.7)
Neutrophils Relative %: 87 %
Platelets: 337 10*3/uL (ref 150–400)
RBC: 5.29 MIL/uL (ref 4.22–5.81)
RDW: 11.9 % (ref 11.5–15.5)
WBC: 19.7 10*3/uL — ABNORMAL HIGH (ref 4.0–10.5)
nRBC: 0 % (ref 0.0–0.2)

## 2021-03-11 LAB — GLUCOSE, CAPILLARY: Glucose-Capillary: 105 mg/dL — ABNORMAL HIGH (ref 70–99)

## 2021-03-11 LAB — TSH: TSH: 0.592 u[IU]/mL (ref 0.350–4.500)

## 2021-03-11 LAB — MAGNESIUM: Magnesium: 2.2 mg/dL (ref 1.7–2.4)

## 2021-03-11 LAB — T4, FREE: Free T4: 1 ng/dL (ref 0.61–1.12)

## 2021-03-11 LAB — HIV ANTIBODY (ROUTINE TESTING W REFLEX): HIV Screen 4th Generation wRfx: NONREACTIVE

## 2021-03-11 MED ORDER — METRONIDAZOLE 500 MG/100ML IV SOLN
500.0000 mg | Freq: Once | INTRAVENOUS | Status: AC
  Administered 2021-03-11: 500 mg via INTRAVENOUS
  Filled 2021-03-11: qty 100

## 2021-03-11 MED ORDER — MELATONIN 5 MG PO TABS
2.5000 mg | ORAL_TABLET | Freq: Every evening | ORAL | Status: DC | PRN
  Filled 2021-03-11: qty 1

## 2021-03-11 MED ORDER — SODIUM CHLORIDE 0.9 % IV SOLN
2.0000 g | Freq: Once | INTRAVENOUS | Status: AC
  Administered 2021-03-11: 2 g via INTRAVENOUS
  Filled 2021-03-11: qty 2

## 2021-03-11 MED ORDER — SODIUM CHLORIDE 0.9 % IV BOLUS
1000.0000 mL | Freq: Once | INTRAVENOUS | Status: AC
  Administered 2021-03-11: 1000 mL via INTRAVENOUS

## 2021-03-11 MED ORDER — OXYCODONE HCL 5 MG PO TABS
5.0000 mg | ORAL_TABLET | ORAL | Status: DC | PRN
Start: 2021-03-11 — End: 2021-03-25
  Administered 2021-03-12 – 2021-03-15 (×2): 5 mg via ORAL
  Filled 2021-03-11 (×2): qty 1

## 2021-03-11 MED ORDER — POTASSIUM CHLORIDE CRYS ER 20 MEQ PO TBCR
40.0000 meq | EXTENDED_RELEASE_TABLET | Freq: Once | ORAL | Status: AC
  Administered 2021-03-11: 40 meq via ORAL
  Filled 2021-03-11: qty 2

## 2021-03-11 MED ORDER — TRIAMCINOLONE ACETONIDE 0.1 % EX OINT
TOPICAL_OINTMENT | Freq: Three times a day (TID) | CUTANEOUS | Status: DC
  Administered 2021-03-17: 1 via TOPICAL
  Filled 2021-03-11 (×4): qty 15

## 2021-03-11 MED ORDER — LACTATED RINGERS IV SOLN: INTRAVENOUS | Status: DC

## 2021-03-11 MED ORDER — ONDANSETRON HCL 4 MG/2ML IJ SOLN: 4.0000 mg | Freq: Four times a day (QID) | INTRAMUSCULAR | Status: DC | PRN

## 2021-03-11 MED ORDER — VANCOMYCIN HCL IN DEXTROSE 1-5 GM/200ML-% IV SOLN
1000.0000 mg | Freq: Once | INTRAVENOUS | Status: AC
  Administered 2021-03-11: 1000 mg via INTRAVENOUS
  Filled 2021-03-11: qty 200

## 2021-03-11 MED ORDER — POLYETHYLENE GLYCOL 3350 17 G PO PACK
17.0000 g | PACK | Freq: Every day | ORAL | Status: DC | PRN
  Administered 2021-03-22: 17 g via ORAL
  Filled 2021-03-11: qty 1

## 2021-03-11 MED ORDER — LACTATED RINGERS IV BOLUS
1000.0000 mL | Freq: Once | INTRAVENOUS | Status: AC
  Administered 2021-03-11: 1000 mL via INTRAVENOUS

## 2021-03-11 MED ORDER — ACETAMINOPHEN 325 MG PO TABS
650.0000 mg | ORAL_TABLET | Freq: Four times a day (QID) | ORAL | Status: DC | PRN
  Administered 2021-03-12 – 2021-03-24 (×3): 650 mg via ORAL
  Filled 2021-03-11 (×3): qty 2

## 2021-03-11 MED ORDER — SODIUM CHLORIDE 0.9 % IV SOLN
2.0000 g | Freq: Three times a day (TID) | INTRAVENOUS | Status: DC
  Administered 2021-03-12: 2 g via INTRAVENOUS
  Filled 2021-03-11 (×4): qty 2

## 2021-03-11 MED ORDER — ENOXAPARIN SODIUM 40 MG/0.4ML IJ SOSY
40.0000 mg | PREFILLED_SYRINGE | INTRAMUSCULAR | Status: DC
  Administered 2021-03-11 – 2021-03-16 (×3): 40 mg via SUBCUTANEOUS
  Filled 2021-03-11 (×10): qty 0.4

## 2021-03-11 MED ORDER — VANCOMYCIN HCL 1500 MG/300ML IV SOLN
1500.0000 mg | Freq: Two times a day (BID) | INTRAVENOUS | Status: DC
  Filled 2021-03-11: qty 300

## 2021-03-11 MED ORDER — INSULIN ASPART 100 UNIT/ML IJ SOLN
0.0000 [IU] | Freq: Three times a day (TID) | INTRAMUSCULAR | Status: DC
  Administered 2021-03-13: 1 [IU] via SUBCUTANEOUS
  Administered 2021-03-13: 2 [IU] via SUBCUTANEOUS
  Filled 2021-03-11 (×3): qty 1

## 2021-03-11 MED ORDER — SERTRALINE HCL 50 MG PO TABS
50.0000 mg | ORAL_TABLET | Freq: Every day | ORAL | Status: DC
  Administered 2021-03-11 – 2021-03-25 (×15): 50 mg via ORAL
  Filled 2021-03-11 (×15): qty 1

## 2021-03-11 MED ORDER — VANCOMYCIN HCL IN DEXTROSE 1-5 GM/200ML-% IV SOLN
1000.0000 mg | Freq: Once | INTRAVENOUS | Status: AC
  Administered 2021-03-12: 1000 mg via INTRAVENOUS
  Filled 2021-03-11: qty 200

## 2021-03-11 MED ORDER — INSULIN ASPART 100 UNIT/ML IJ SOLN: 0.0000 [IU] | Freq: Every day | INTRAMUSCULAR | Status: DC

## 2021-03-11 NOTE — ED Provider Notes (Signed)
[facility] Emergency Department Provider Note  ____________________________________________   Event Date/Time   First MD Initiated Contact with Patient 03/11/21 1624     (approximate)  I have reviewed the triage vital signs and the nursing notes.   HISTORY  Chief Complaint Rash    HPI Randy Henry is a 20 y.o. male with history of asthma who comes in for a rash.  Patient reports having a rash that started a few days ago.  He reports that his rashes on his bilateral arms but denies it being on his hands or his feet.  He states that he thinks he was bit by a bunch of spiders but then some more wounds popped up this morning he stated that he did not think there was any spiders that could have done that today and wanted to make sure that it was nothing more severe.  He states that he just feels hot and feels kind of out of it.  Denies any chest pain, abdominal pain.  Denies sexual intercourse with men or being around nobody else with a rash. He denies IV drugs to me reports only cocaine.             Past Medical History:  Diagnosis Date   ADHD (attention deficit hyperactivity disorder)     Patient Active Problem List   Diagnosis Date Noted   Dysgraphia 03/28/2019   Medication management 03/28/2019   Patient counseled 03/28/2019   ADHD (attention deficit hyperactivity disorder), combined type 12/07/2015    History reviewed. No pertinent surgical history.  Prior to Admission medications   Medication Sig Start Date End Date Taking? Authorizing Provider  Amphetamine Sulfate (EVEKEO) 10 MG TABS Take 10-20 mg by mouth daily. 12/11/19   Paretta-Leahey, Miachel Roux, NP  sertraline (ZOLOFT) 25 MG tablet TAKE 2 TABLETS(50 MG) BY MOUTH DAILY 08/10/20   Dedlow, Ether Griffins, NP  triamcinolone ointment (KENALOG) 0.1 % APP EXT AA BID UNTIL CLEAR THEN PRN 02/13/18   [provider]  valACYclovir (VALTREX) 500 MG tablet Take 500 mg by mouth daily. 11/10/16    [provider]  levocetirizine (XYZAL) 5 MG tablet TAKE 1 TABLET PO EVERY EVENING Patient not taking: No sig reported 11/21/17 07/08/20  [provider]    Allergies Shellfish-derived products  Family History  Problem Relation Age of Onset   ADD / ADHD Mother    Cancer Maternal Grandfather     Social History Social History   Tobacco Use   Smoking status: Passive Smoke Exposure - Never Smoker   Smokeless tobacco: Never  Substance Use Topics   Alcohol use: No      Review of Systems Constitutional: No fever/chills, positive weakness Eyes: No visual changes. ENT: No sore throat. Cardiovascular: Denies chest pain. Respiratory: Denies shortness of breath. Gastrointestinal: No abdominal pain.  No nausea, no vomiting.  No diarrhea.  No constipation. Genitourinary: Negative for dysuria. Musculoskeletal: Negative for back pain. Skin: Positive rash Neurological: Negative for headaches, focal weakness or numbness. All other ROS negative ____________________________________________   PHYSICAL EXAM:  VITAL SIGNS: ED Triage Vitals  Enc Vitals Group     BP 03/11/21 1542 140/82     Pulse Rate 03/11/21 1542 (!) 141     Resp 03/11/21 1542 20     Temp 03/11/21 1542 98.9 F (37.2 C)     Temp Source 03/11/21 1542 Oral     SpO2 03/11/21 1542 100 %     Weight --  Height 03/11/21 1543 5\' 11"  (1.803 m)     Head Circumference --      Peak Flow --      Pain Score 03/11/21 1543 0     Pain Loc --      Pain Edu? --      Excl. in GC? --     Constitutional: Alert and oriented. Well appearing and in no acute distress. Eyes: Conjunctivae are normal. EOMI. Head: Atraumatic. Nose: No congestion/rhinnorhea. Mouth/Throat: Mucous membranes are moist.   Neck: No stridor. Trachea Midline. FROM Cardiovascular: Tachycardia, regular rhythm. Grossly normal heart sounds.  Good peripheral circulation. Respiratory: Normal respiratory effort.  No retractions. Lungs  CTAB. Gastrointestinal: Soft and nontender. No distention. No abdominal bruits.  Musculoskeletal: No lower extremity tenderness nor edema.  No joint effusions. Neurologic:  Normal speech and language. No gross focal neurologic deficits are appreciated.  Skin:  Skin is warm, dry and intact.  Rash see media tab.  No involvement of genitals, oropharynx or hand and feet.  Does have some eczema rash in between his the flexor part of his elbow and his knee Psychiatric: Mood and affect are normal. Speech and behavior are normal. GU: Deferred   ____________________________________________   LABS (all labs ordered are listed, but only abnormal results are displayed)  Labs Reviewed  LACTIC ACID, PLASMA - Abnormal; Notable for the following components:      Result Value   Lactic Acid, Venous 5.3 (*)    All other components within normal limits  COMPREHENSIVE METABOLIC PANEL - Abnormal; Notable for the following components:   Potassium 3.4 (*)    CO2 21 (*)    Glucose, Bld 167 (*)    ALT 52 (*)    All other components within normal limits  CBC WITH DIFFERENTIAL/PLATELET - Abnormal; Notable for the following components:   WBC 19.7 (*)    MCHC 37.1 (*)    Neutro Abs 17.0 (*)    All other components within normal limits  CHLAMYDIA/NGC RT PCR ([facility] ONLY)            LACTIC ACID, PLASMA  HIV ANTIBODY (ROUTINE TESTING W REFLEX)  RPR  URINALYSIS, ROUTINE W REFLEX MICROSCOPIC  MAGNESIUM  TSH  T4, FREE  URINE DRUG SCREEN, QUALITATIVE ([facility] ONLY)   ____________________________________________   ED ECG REPORT I, 03/13/21, the attending physician, personally viewed and interpreted this ECG.  Sinus tachycardia rate of 136, no ST elevation, T wave versions in lead III and aVF, normal intervals ____________________________________________  PROCEDURES  Procedure(s) performed (including Critical Care):  .1-3 Lead EKG Interpretation Performed by: Concha Se, MD Authorized by: Concha Se, MD     Interpretation: abnormal     ECG rate:  140s   ECG rate assessment: tachycardic     Rhythm: sinus tachycardia     Ectopy: none     Conduction: normal   .Critical Care Performed by: Concha Se, MD Authorized by: Concha Se, MD   Critical care provider statement:    Critical care time (minutes):  45   Critical care was necessary to treat or prevent imminent or life-threatening deterioration of the following conditions:  Sepsis   Critical care was time spent personally by me on the following activities:  Discussions with consultants, evaluation of patient's response to treatment, examination of patient, ordering and performing treatments and interventions, ordering and review of laboratory studies, ordering and review of radiographic studies, pulse oximetry, re-evaluation of patient's condition, obtaining history  from patient or surrogate and review of old charts   ____________________________________________   INITIAL IMPRESSION / ASSESSMENT AND PLAN / ED COURSE  EXAVIER LINA was evaluated in Emergency Department on 03/11/2021 for the symptoms described in the history of present illness. He was evaluated in the context of the global COVID-19 pandemic, which necessitated consideration that the patient might be at risk for infection with the SARS-CoV-2 virus that causes COVID-19. Institutional protocols and algorithms that pertain to the evaluation of patients at risk for COVID-19 are in a state of rapid change based on information released by regulatory bodies including the CDC and federal and state organizations. These policies and algorithms were followed during the patient's care in the ED.    Patient comes in with concerns for rash and significant tachycardia.  On examination patient's rash looks more like insect bites that have been scratched and have some erythema and redness around them that look more like cellulitis.  However patient does have eczema so that puts  him at high risk for monkey pox.  We will proceed with HIV, syphilis, gonorrhea, chlamydia testing. BC to rule out bactermia.  I did discuss with ID Dr. Rivka Safer who agreed it most likely is not multipox but recommended [place] to at least tested just to be safe we will leave on contact and airborne precautions/   Patient's labs come back with significantly elevated lactate and elevated white count.  Patient meet sepsis criteria.  Patient was started on broad-spectrum antibiotics.  We will discussed with the hospital team for admission       ____________________________________________   FINAL CLINICAL IMPRESSION(S) / ED DIAGNOSES   Final diagnoses:  Sepsis, due to unspecified organism, unspecified whether acute organ dysfunction present (HCC)  Cellulitis, unspecified cellulitis site  Rash      MEDICATIONS GIVEN DURING THIS VISIT:  Medications  lactated ringers infusion (has no administration in time range)  metroNIDAZOLE (FLAGYL) IVPB 500 mg (500 mg Intravenous New Bag/Given 03/11/21 1831)  vancomycin (VANCOCIN) IVPB 1000 mg/200 mL premix (1,000 mg Intravenous New Bag/Given 03/11/21 1750)  lactated ringers bolus 1,000 mL (has no administration in time range)  sodium chloride 0.9 % bolus 1,000 mL (1,000 mLs Intravenous New Bag/Given 03/11/21 1735)  sodium chloride 0.9 % bolus 1,000 mL (1,000 mLs Intravenous New Bag/Given 03/11/21 1735)  ceFEPIme (MAXIPIME) 2 g in sodium chloride 0.9 % 100 mL IVPB (0 g Intravenous Stopped 03/11/21 1831)     ED Discharge Orders     None        Note:  This document was prepared using Dragon voice recognition software and may include unintentional dictation errors.    Concha Se, MD 03/11/21 [phone]

## 2021-03-11 NOTE — Progress Notes (Signed)
CODE SEPSIS - PHARMACY COMMUNICATION  **Broad Spectrum Antibiotics should be administered within 1 hour of Sepsis diagnosis**  Time Code Sepsis Called/Page Received: 1740  Antibiotics Ordered: vancomycin/cefepime/metronidazole  Time of 1st antibiotic administration: 1747   Pricilla Riffle, PharmD, BCPS Clinical Pharmacist 03/11/2021 6:29 PM

## 2021-03-11 NOTE — ED Notes (Signed)
Pt appears uncomfortable in bed. A/ox4. Pt states he woke this am with rash all over body. 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 Left arm has abscess looking scabs. Per MD, being r/o monkeypox as well as bacterial infection.

## 2021-03-11 NOTE — ED Triage Notes (Addendum)
Pt to ER via POV with complaints of a rash on bilateral arms and hands that started this morning . Pt has eczema and has multiple wounds on his body, also reports getting multiple spider bites this week. Hx of IV drug use, reports last use was over a month ago.   HR 141 in triage. Denies chest pain. Reports feeling hot, and "out" of it. States he has never felt this bad after something "biting" him. Denies CP or SHOB. No weakness.

## 2021-03-11 NOTE — ED Notes (Signed)
Pt gave verbal consent for HIV testing to be completed while here in the ED.  Due to patients symptoms, patient was placed in family room and away from the lobby for airborne precautions.

## 2021-03-11 NOTE — Progress Notes (Signed)
PHARMACY -  BRIEF ANTIBIOTIC NOTE   Pharmacy has received consult(s) for vancomycin and cefepime from an ED provider.  The patient's profile has been reviewed for ht/wt/allergies/indication/available labs.    One time order(s) placed for vancomycin 1 g + cefepime 2 g  Further antibiotics/pharmacy consults should be ordered by admitting physician if indicated.                       Thank you,  Pricilla Riffle, PharmD, BCPS Clinical Pharmacist 03/11/2021 6:31 PM

## 2021-03-11 NOTE — Sepsis Progress Note (Signed)
EL[facility] monitoring sepsis protocol 

## 2021-03-11 NOTE — H&P (Addendum)
History and Physical  Randy Henry KNL:[id number] DOB: 2000-10-13 DOA: 03/11/2021  Referring physician: Dr. Fuller Plan, EDP  PCP: [name], MD  Outpatient Specialists: Psychology. Patient coming from: Home.  Chief Complaint: Skin lesions.  HPI: Randy Henry is a 20 y.o. male with medical history significant for eczema, ADHD, who presented to [facility] ED due to sudden onset skin lesions and erythema affecting his upper extremities bilaterally.  He picks at his skin and has chronic skin lesions in his upper extremities, however he noted this morning after waking up that there were several skin lesions bigger than the rest with erythema, tenderness, and edema.  He states that he often picks at his skin when he is not busy doing something or when his phone is not working.  He denies use of IV drug.  He was concerned about this change.  He works as a Administrator, thought he was bitten by spiders and came in to the ED for further evaluation.  Upon presentation to the ED, the patient was tachycardic with WBC 19.7 K and lactic acid of 5.3.  Blood cultures were obtained and patient was started on IV antibiotics empirically IV vancomycin, cefepime and IV Flagyl.  TRH was called to admit due to concern for sepsis secondary to cellulitis.  ED Course: Temperature 98.9.  BP 138/81, pulse 95 from 141, respiration rate 18, O2 saturation 100% on room air.  Lab studies remarkable for WBC 19.7, neutrophil count 17.  Serum potassium 3.4, serum bicarb 21, glucose 167, creatinine 1.06, anion gap 15.  Magnesium 2.2.  Review of Systems: Review of systems as noted in the HPI. All other systems reviewed and are negative.   Past Medical History:  Diagnosis Date   ADHD (attention deficit hyperactivity disorder)    History reviewed. No pertinent surgical history.  Social History:  reports that he is a non-smoker but has been exposed to tobacco smoke. He has never used smokeless tobacco. He reports that he does not drink  alcohol. No history on file for drug use.   Allergies  Allergen Reactions   Shellfish-Derived Products     Family History  Problem Relation Age of Onset   ADD / ADHD Mother    Cancer Maternal Grandfather       Prior to Admission medications   Medication Sig Start Date End Date Taking? Authorizing Provider  Amphetamine Sulfate (EVEKEO) 10 MG TABS Take 10-20 mg by mouth daily. 12/11/19   Paretta-Leahey, Miachel Roux, NP  sertraline (ZOLOFT) 25 MG tablet TAKE 2 TABLETS(50 MG) BY MOUTH DAILY 08/10/20   Dedlow, Ether Griffins, NP  triamcinolone ointment (KENALOG) 0.1 % APP EXT AA BID UNTIL CLEAR THEN PRN 02/13/18   [provider]  valACYclovir (VALTREX) 500 MG tablet Take 500 mg by mouth daily. 11/10/16   [provider]  levocetirizine (XYZAL) 5 MG tablet TAKE 1 TABLET PO EVERY EVENING Patient not taking: No sig reported 11/21/17 07/08/20  [provider]    Physical Exam: BP (!) 144/84 (BP Location: Right Arm)   Pulse 98   Temp 98.9 F (37.2 C) (Oral)   Resp 18   Ht 5\' 11"  (1.803 m)   Wt 89.8 kg   SpO2 100%   BMI 27.62 kg/m   General: 20 y.o. year-old male well developed well nourished in no acute distress.  Alert and oriented x3. Cardiovascular: Regular rate and rhythm with no rubs or gallops.  No thyromegaly or JVD noted.  No lower extremity edema. 2/4 pulses  in all 4 extremities. Respiratory: Clear to auscultation with no wheezes or rales. Good inspiratory effort. Abdomen: Soft nontender nondistended with normal bowel sounds x4 quadrants. Muskuloskeletal: No cyanosis, clubbing or edema noted bilaterally Neuro: CN II-XII intact, strength, sensation, reflexes Skin:   Psychiatry: Judgement and insight appear normal. Mood is appropriate for condition and setting          Labs on Admission:  Basic Metabolic Panel: Recent Labs  Lab 03/11/21 1545 03/11/21 1746  NA 135  --   K 3.4*  --   CL 99  --   CO2 21*  --   GLUCOSE 167*  --   BUN 13  --   CREATININE  1.06  --   CALCIUM 9.6  --   MG  --  2.2   Liver Function Tests: Recent Labs  Lab 03/11/21 1545  AST 40  ALT 52*  ALKPHOS 80  BILITOT 0.9  PROT 7.8  ALBUMIN 4.7   No results for input(s): LIPASE, AMYLASE in the last 168 hours. No results for input(s): AMMONIA in the last 168 hours. CBC: Recent Labs  Lab 03/11/21 1545  WBC 19.7*  NEUTROABS 17.0*  HGB 16.6  HCT 44.8  MCV 84.7  PLT 337   Cardiac Enzymes: No results for input(s): CKTOTAL, CKMB, CKMBINDEX, TROPONINI in the last 168 hours.  BNP (last 3 results) No results for input(s): BNP in the last 8760 hours.  ProBNP (last 3 results) No results for input(s): PROBNP in the last 8760 hours.  CBG: No results for input(s): GLUCAP in the last 168 hours.  Radiological Exams on Admission: No results found.  EKG: I independently viewed the EKG done and my findings are as followed: Sinus tachycardia rate of 136.  Nonspecific ST-T changes.  QTc 423.  Assessment/Plan Present on Admission:  Sepsis (HCC)  Active Problems:   Sepsis (HCC)  Sepsis secondary to bilateral upper extremity cellulitis Heart rate 140s, WBC 19 K, lactic acid 5.3, cellulitis in upper extremities Blood cultures obtained, follow cultures. Started on IV antibiotics empirically with vancomycin, cefepime and IV Flagyl. Continue IV vancomycin and cefepime. Obtain MRSA screen. IV fluid hydration Pain control Monitor fever curve and WBC Repeat CBC in the morning  Lactic acidosis Presented with lactic acid greater than 5 Continue IV fluid Lactic acid downtrending to 3 Repeat lactic acid in the morning  Elevated BP, possibly related to pain No history of hypertension Treat underlying condition Pain control IV antiemetics as needed with parameters  Hypokalemia Potassium 3.4 Repleted orally.  Hyperglycemia, in the setting of sepsis Obtain hemoglobin A1c Insulin sliding scale.    DVT prophylaxis: Subcu Lovenox daily  Code Status: Full  code  Family Communication: None at bedside  Disposition Plan: Admitted to MedSurg unit with remote telemetry  Consults called: Infectious disease contacted by EDP  Admission status: Inpatient status.  Patient will require at least 2 midnight for further evaluation and treatment of present condition.     Darlin Drop MD [facility] Pager [phone]  If 7PM-7AM, please contact night-coverage [email or web address] Password [facility]  03/11/2021, 8:45 PM

## 2021-03-11 NOTE — ED Notes (Signed)
MD Fuller Plan at bedside for eval

## 2021-03-11 NOTE — ED Notes (Signed)
MD Fuller Plan to call ID for consult

## 2021-03-11 NOTE — ED Notes (Signed)
Pt informed again that he is being r/o monkeypox at this time

## 2021-03-11 NOTE — Progress Notes (Signed)
Pharmacy Antibiotic Note  Randy Henry is a 20 y.o. male admitted on 03/11/2021. Pharmacy has been consulted for vancomycin dosing.  Plan: Vancomycin 1 g given. Add additional 1 g for total 2 g loading dose. Follow with vancomycin 1500 mg IV Q 12 hrs.  Goal AUC 400-550 Expected AUC: 451 SCr used: 1.06   Height: 5\' 11"  (180.3 cm) Weight: 89.8 kg (198 lb) IBW/kg (Calculated) : 75.3  Temp (24hrs), Avg:98.9 F (37.2 C), Min:98.9 F (37.2 C), Max:98.9 F (37.2 C)  Recent Labs  Lab 03/11/21 1545 03/11/21 1746  WBC 19.7*  --   CREATININE 1.06  --   LATICACIDVEN 5.3* 3.6*    Estimated Creatinine Clearance: 118.4 mL/min (by C-G formula based on SCr of 1.06 mg/dL).    Allergies  Allergen Reactions   Shellfish-Derived Products     Antimicrobials this admission: Vancomycin 9/15 >> Cefepime 9/15 >>   Microbiology results: 9/15 BCx: pending 9/15 Culture (superficial, arm): pending 9/15 Monkeypox DNA: pending  Thank you for allowing pharmacy to be a part of this patient's care.  10/15, PharmD, BCPS Clinical Pharmacist 03/11/2021 9:54 PM

## 2021-03-12 ENCOUNTER — Inpatient Hospital Stay: Payer: Medicaid Other

## 2021-03-12 ENCOUNTER — Encounter: Payer: Self-pay | Admitting: Internal Medicine

## 2021-03-12 DIAGNOSIS — A419 Sepsis, unspecified organism: Secondary | ICD-10-CM | POA: Diagnosis not present

## 2021-03-12 DIAGNOSIS — L209 Atopic dermatitis, unspecified: Secondary | ICD-10-CM | POA: Diagnosis not present

## 2021-03-12 DIAGNOSIS — B9561 Methicillin susceptible Staphylococcus aureus infection as the cause of diseases classified elsewhere: Secondary | ICD-10-CM | POA: Diagnosis not present

## 2021-03-12 DIAGNOSIS — R7881 Bacteremia: Secondary | ICD-10-CM

## 2021-03-12 LAB — MRSA NEXT GEN BY PCR, NASAL: MRSA by PCR Next Gen: NOT DETECTED

## 2021-03-12 LAB — URINE DRUG SCREEN, QUALITATIVE (ARMC ONLY)
Amphetamines, Ur Screen: POSITIVE — AB
Barbiturates, Ur Screen: NOT DETECTED
Benzodiazepine, Ur Scrn: NOT DETECTED
Cannabinoid 50 Ng, Ur ~~LOC~~: NOT DETECTED
Cocaine Metabolite,Ur ~~LOC~~: NOT DETECTED
MDMA (Ecstasy)Ur Screen: NOT DETECTED
Methadone Scn, Ur: NOT DETECTED
Opiate, Ur Screen: NOT DETECTED
Phencyclidine (PCP) Ur S: NOT DETECTED
Tricyclic, Ur Screen: NOT DETECTED

## 2021-03-12 LAB — URINALYSIS, ROUTINE W REFLEX MICROSCOPIC
Bilirubin Urine: NEGATIVE
Glucose, UA: NEGATIVE mg/dL
Hgb urine dipstick: NEGATIVE
Ketones, ur: NEGATIVE mg/dL
Leukocytes,Ua: NEGATIVE
Nitrite: NEGATIVE
Protein, ur: NEGATIVE mg/dL
Specific Gravity, Urine: 1.006 (ref 1.005–1.030)
pH: 8 (ref 5.0–8.0)

## 2021-03-12 LAB — BASIC METABOLIC PANEL
Anion gap: 6 (ref 5–15)
BUN: 9 mg/dL (ref 6–20)
CO2: 27 mmol/L (ref 22–32)
Calcium: 8.7 mg/dL — ABNORMAL LOW (ref 8.9–10.3)
Chloride: 103 mmol/L (ref 98–111)
Creatinine, Ser: 0.94 mg/dL (ref 0.61–1.24)
GFR, Estimated: >60 mL/min (ref >60)
Glucose, Bld: 107 mg/dL — ABNORMAL HIGH (ref 70–99)
Potassium: 3.7 mmol/L (ref 3.5–5.1)
Sodium: 136 mmol/L (ref 135–145)

## 2021-03-12 LAB — BLOOD CULTURE ID PANEL (REFLEXED) - BCID2

## 2021-03-12 LAB — CHLAMYDIA/NGC RT PCR (ARMC ONLY)
Chlamydia Tr: NOT DETECTED
N gonorrhoeae: NOT DETECTED

## 2021-03-12 LAB — HEMOGLOBIN A1C
Hgb A1c MFr Bld: 4.9 % (ref 4.8–5.6)
Mean Plasma Glucose: 93.93 mg/dL

## 2021-03-12 LAB — CBC
HCT: 39.8 % (ref 39.0–52.0)
Hemoglobin: 14 g/dL (ref 13.0–17.0)
MCH: 30.2 pg (ref 26.0–34.0)
MCHC: 35.2 g/dL (ref 30.0–36.0)
MCV: 86 fL (ref 80.0–100.0)
Platelets: 242 10*3/uL (ref 150–400)
RBC: 4.63 MIL/uL (ref 4.22–5.81)
RDW: 12 % (ref 11.5–15.5)
WBC: 8.5 10*3/uL (ref 4.0–10.5)
nRBC: 0 % (ref 0.0–0.2)

## 2021-03-12 LAB — RPR: RPR Ser Ql: NONREACTIVE

## 2021-03-12 LAB — LACTIC ACID, PLASMA: Lactic Acid, Venous: 0.8 mmol/L (ref 0.5–1.9)

## 2021-03-12 LAB — GLUCOSE, CAPILLARY
Glucose-Capillary: 117 mg/dL — ABNORMAL HIGH (ref 70–99)
Glucose-Capillary: 104 mg/dL — ABNORMAL HIGH (ref 70–99)
Glucose-Capillary: 99 mg/dL (ref 70–99)

## 2021-03-12 MED ORDER — DILTIAZEM HCL 30 MG PO TABS
30.0000 mg | ORAL_TABLET | Freq: Four times a day (QID) | ORAL | Status: DC
  Administered 2021-03-12 – 2021-03-13 (×4): 30 mg via ORAL
  Filled 2021-03-12 (×7): qty 1

## 2021-03-12 MED ORDER — ADENOSINE 6 MG/2ML IV SOLN
6.0000 mg | Freq: Once | INTRAVENOUS | Status: DC
  Filled 2021-03-12: qty 2

## 2021-03-12 MED ORDER — ENSURE ENLIVE PO LIQD
237.0000 mL | Freq: Three times a day (TID) | ORAL | Status: DC
  Administered 2021-03-13 – 2021-03-24 (×27): 237 mL via ORAL

## 2021-03-12 MED ORDER — VANCOMYCIN HCL 1750 MG/350ML IV SOLN
1750.0000 mg | Freq: Two times a day (BID) | INTRAVENOUS | Status: DC
  Filled 2021-03-12 (×2): qty 350

## 2021-03-12 MED ORDER — POTASSIUM CHLORIDE CRYS ER 20 MEQ PO TBCR
40.0000 meq | EXTENDED_RELEASE_TABLET | Freq: Once | ORAL | Status: AC
  Administered 2021-03-12: 40 meq via ORAL
  Filled 2021-03-12: qty 2

## 2021-03-12 MED ORDER — CEFAZOLIN SODIUM-DEXTROSE 2-4 GM/100ML-% IV SOLN
2.0000 g | Freq: Three times a day (TID) | INTRAVENOUS | Status: DC
  Administered 2021-03-12 – 2021-03-16 (×13): 2 g via INTRAVENOUS
  Filled 2021-03-12 (×15): qty 100

## 2021-03-12 NOTE — Plan of Care (Signed)

## 2021-03-12 NOTE — Consult Note (Signed)
PHARMACY - PHYSICIAN COMMUNICATION CRITICAL VALUE ALERT - BLOOD CULTURE IDENTIFICATION (BCID)  Randy Henry is an 20 y.o. male who presented to [facility] on 03/11/2021 with a chief complaint of skin lesions and erythema.  Assessment:  presented to [facility] ED due to sudden onset skin lesions and erythema affecting his upper extremities bilaterally.  He picks at his skin and has chronic skin lesions in his upper extremities, however he noted this morning after waking up that there were several skin lesions bigger than the rest with erythema, tenderness, and edema.  Name of physician (or Provider) Contacted: N/A  Current antibiotics: Ancef 2gms q8hrs  Changes to prescribed antibiotics recommended:  Patient is on recommended antibiotics - No changes needed  Results for orders placed or performed during the hospital encounter of 03/11/21  Blood Culture ID Panel (Reflexed) (Collected: 03/11/2021  5:46 PM)  Result Value Ref Range   Enterococcus faecalis NOT DETECTED NOT DETECTED   Enterococcus Faecium NOT DETECTED NOT DETECTED   Listeria monocytogenes NOT DETECTED NOT DETECTED   Staphylococcus species DETECTED (A) NOT DETECTED   Staphylococcus aureus (BCID) DETECTED (A) NOT DETECTED   Staphylococcus epidermidis NOT DETECTED NOT DETECTED   Staphylococcus lugdunensis NOT DETECTED NOT DETECTED   Streptococcus species NOT DETECTED NOT DETECTED   Streptococcus agalactiae NOT DETECTED NOT DETECTED   Streptococcus pneumoniae NOT DETECTED NOT DETECTED   Streptococcus pyogenes NOT DETECTED NOT DETECTED   A.calcoaceticus-baumannii NOT DETECTED NOT DETECTED   Bacteroides fragilis NOT DETECTED NOT DETECTED   Enterobacterales NOT DETECTED NOT DETECTED   Enterobacter cloacae complex NOT DETECTED NOT DETECTED   Escherichia coli NOT DETECTED NOT DETECTED   Klebsiella aerogenes NOT DETECTED NOT DETECTED   Klebsiella oxytoca NOT DETECTED NOT DETECTED   Klebsiella pneumoniae NOT DETECTED NOT DETECTED    Proteus species NOT DETECTED NOT DETECTED   Salmonella species NOT DETECTED NOT DETECTED   Serratia marcescens NOT DETECTED NOT DETECTED   Haemophilus influenzae NOT DETECTED NOT DETECTED   Neisseria meningitidis NOT DETECTED NOT DETECTED   Pseudomonas aeruginosa NOT DETECTED NOT DETECTED   Stenotrophomonas maltophilia NOT DETECTED NOT DETECTED   Candida albicans NOT DETECTED NOT DETECTED   Candida auris NOT DETECTED NOT DETECTED   Candida glabrata NOT DETECTED NOT DETECTED   Candida krusei NOT DETECTED NOT DETECTED   Candida parapsilosis NOT DETECTED NOT DETECTED   Candida tropicalis NOT DETECTED NOT DETECTED   Cryptococcus neoformans/gattii NOT DETECTED NOT DETECTED   Meth resistant mecA/C and MREJ NOT DETECTED NOT DETECTED    [name] 03/12/2021  12:04 PM

## 2021-03-12 NOTE — Progress Notes (Addendum)
Progress Note    Randy Henry  QIH:[id number] DOB: 03-11-2001  DOA: 03/11/2021 PCP: [name], MD      Brief Narrative:    Medical records reviewed and are as summarized below:  Randy Henry is a 20 y.o. male with medical history significant for COVID-19 infection in January 2022, ADHD, substance use disorder, asthma, who presented to the hospital because of multiple skin lesions on his upper extremities.      Assessment/Plan:   Active Problems:   Sepsis (HCC)   Nutrition Problem: Inadequate oral intake Etiology: acute illness  Signs/Symptoms: per patient/family report   Body mass index is 25.37 kg/m.   Severe sepsis secondary to staph aureus bacteremia and ?left upper extremity cellulitis: Lactic acid was 5.3 on admission.  MRSA screen was negative.  Continue IV cefazolin.  2D echo has been ordered for further evaluation.  Chest x-ray has been ordered as part of sepsis work-up.  Follow-up with ID for further recommendations.  Multiple lesions/rash on bilateral upper extremities: Etiology is unclear.  Monkey pox is being ruled out.  Continue airborne and contact precautions for now.  SVT: Patient went into SVT this morning with heart rate up to 170 on telemetry.  Heart rate slowed down without any medication.  He has been started on oral Cardizem for SVT.  Monitor on telemetry.  Hypokalemia: Improving.  Continue potassium repletion.  ADHD: He is on Evekeo at home.  Substance use disorder: Grandma reports that patient uses illicit drugs including methadone.  Follow-up with case manager/social worker to assist with outpatient resources.     Diet Order             Diet regular Room service appropriate? Yes; Fluid consistency: Thin  Diet effective now                      Consultants: Infectious disease  Procedures: None    Medications:    diltiazem  30 mg Oral Q6H   enoxaparin (LOVENOX) injection  40 mg Subcutaneous Q24H    feeding supplement  237 mL Oral TID BM   insulin aspart  0-5 Units Subcutaneous QHS   insulin aspart  0-9 Units Subcutaneous TID WC   sertraline  50 mg Oral Daily   triamcinolone ointment   Topical TID   Continuous Infusions:   ceFAZolin (ANCEF) IV 2 g (03/12/21 1247)   lactated ringers Stopped (03/12/21 1122)     Anti-infectives (From admission, onward)    Start     Dose/Rate Route Frequency Ordered Stop   03/12/21 1200  vancomycin (VANCOREADY) IVPB 1750 mg/350 mL  Status:  Discontinued        1,750 mg 175 mL/hr over 120 Minutes Intravenous Every 12 hours 03/12/21 0742 03/12/21 1006   03/12/21 1100  vancomycin (VANCOREADY) IVPB 1500 mg/300 mL  Status:  Discontinued        1,500 mg 150 mL/hr over 120 Minutes Intravenous Every 12 hours 03/11/21 2150 03/12/21 0742   03/12/21 1100  ceFAZolin (ANCEF) IVPB 2g/100 mL premix        2 g 200 mL/hr over 30 Minutes Intravenous Every 8 hours 03/12/21 1006     03/12/21 0000  ceFEPIme (MAXIPIME) 2 g in sodium chloride 0.9 % 100 mL IVPB  Status:  Discontinued        2 g 200 mL/hr over 30 Minutes Intravenous Every 8 hours 03/11/21 2037 03/12/21 1006   03/11/21 2200  vancomycin (VANCOCIN) IVPB  1000 mg/200 mL premix        1,000 mg 200 mL/hr over 60 Minutes Intravenous  Once 03/11/21 2037 03/12/21 0154   03/11/21 1745  ceFEPIme (MAXIPIME) 2 g in sodium chloride 0.9 % 100 mL IVPB        2 g 200 mL/hr over 30 Minutes Intravenous  Once 03/11/21 1733 03/11/21 1831   03/11/21 1745  metroNIDAZOLE (FLAGYL) IVPB 500 mg        500 mg 100 mL/hr over 60 Minutes Intravenous  Once 03/11/21 1733 03/11/21 1942   03/11/21 1745  vancomycin (VANCOCIN) IVPB 1000 mg/200 mL premix        1,000 mg 200 mL/hr over 60 Minutes Intravenous  Once 03/11/21 1733 03/11/21 1907              Family Communication/Anticipated D/C date and plan/Code Status   DVT prophylaxis: enoxaparin (LOVENOX) injection 40 mg Start: 03/11/21 2000 SCDs Start: 03/11/21 1939      Code Status: Full Code  Family Communication: None Disposition Plan:    Status is: Inpatient  Remains inpatient appropriate because:IV treatments appropriate due to intensity of illness or inability to take PO  Dispo:  Patient From: Home  Planned Disposition: Home  Medically stable for discharge: No             Subjective:   Interval events noted.  He said he has had a rash for about 2 weeks.  However he noticed to continue lesions on the left upper extremity (one on the arm and one on the forearm).  He then developed swelling and tenderness of the left.  Objective:    Vitals:   03/12/21 0206 03/12/21 0330 03/12/21 0649 03/12/21 0851  BP:  (!) 109/54  140/73  Pulse:  (!) 109  (!) 102  Resp:  18  16  Temp: 99.4 F (37.4 C) 99 F (37.2 C) 99.8 F (37.7 C) 100.1 F (37.8 C)  TempSrc: Oral Oral Oral Oral  SpO2:  98%  100%  Weight:      Height:       No data found.   Intake/Output Summary (Last 24 hours) at 03/12/2021 1602 Last data filed at 03/12/2021 1449 Gross per 24 hour  Intake 1415.35 ml  Output --  Net 1415.35 ml   Filed Weights   03/11/21 1804 03/11/21 2317  Weight: 89.8 kg 82.5 kg    Exam:  GEN: NAD SKIN: Multiple hyperpigmented rash/lesions on bilateral upper extremities EYES: EOMI ENT: MMM CV: RRR PULM: CTA B ABD: soft, ND, NT, +BS CNS: AAO x 3, non focal EXT: No edema or tenderness        Data Reviewed:   I have personally reviewed following labs and imaging studies:  Labs: Labs show the following:   Basic Metabolic Panel: Recent Labs  Lab 03/11/21 1545 03/11/21 1746 03/12/21 0436  NA 135  --  136  K 3.4*  --  3.7  CL 99  --  103  CO2 21*  --  27  GLUCOSE 167*  --  107*  BUN 13  --  9  CREATININE 1.06  --  0.94  CALCIUM 9.6  --  8.7*  MG  --  2.2  --    GFR Estimated Creatinine Clearance: 133.5 mL/min (by C-G formula based on SCr of 0.94 mg/dL). Liver Function Tests: Recent Labs  Lab 03/11/21 1545  AST 40   ALT 52*  ALKPHOS 80  BILITOT 0.9  PROT 7.8  ALBUMIN 4.7  No results for input(s): LIPASE, AMYLASE in the last 168 hours. No results for input(s): AMMONIA in the last 168 hours. Coagulation profile No results for input(s): INR, PROTIME in the last 168 hours.  CBC: Recent Labs  Lab 03/11/21 1545 03/12/21 0436  WBC 19.7* 8.5  NEUTROABS 17.0*  --   HGB 16.6 14.0  HCT 44.8 39.8  MCV 84.7 86.0  PLT 337 242   Cardiac Enzymes: No results for input(s): CKTOTAL, CKMB, CKMBINDEX, TROPONINI in the last 168 hours. BNP (last 3 results) No results for input(s): PROBNP in the last 8760 hours. CBG: Recent Labs  Lab 03/11/21 2339 03/12/21 1251  GLUCAP 105* 117*   D-Dimer: No results for input(s): DDIMER in the last 72 hours. Hgb A1c: Recent Labs    03/12/21 0436  HGBA1C 4.9   Lipid Profile: No results for input(s): CHOL, HDL, LDLCALC, TRIG, CHOLHDL, LDLDIRECT in the last 72 hours. Thyroid function studies: Recent Labs    03/11/21 1746  TSH 0.592   Anemia work up: No results for input(s): VITAMINB12, FOLATE, FERRITIN, TIBC, IRON, RETICCTPCT in the last 72 hours. Sepsis Labs: Recent Labs  Lab 03/11/21 1545 03/11/21 1746 03/12/21 0436  WBC 19.7*  --  8.5  LATICACIDVEN 5.3* 3.6* 0.8    Microbiology Recent Results (from the past 240 hour(s))  Blood culture (routine x 2)     Status: None (Preliminary result)   Collection Time: 03/11/21  5:46 PM   Specimen: BLOOD  Result Value Ref Range Status   Specimen Description BLOOD LEFT ANTECUBITAL  Final   Special Requests   Final    BOTTLES DRAWN AEROBIC AND ANAEROBIC Blood Culture adequate volume   Culture  Setup Time   Final    GRAM POSITIVE COCCI IN BOTH AEROBIC AND ANAEROBIC BOTTLES Organism ID to follow CRITICAL RESULT CALLED TO, READ BACK BY AND VERIFIED WITH: Algie Coffer, PHARMD AT 7829 ON 03/12/21 BY GM Performed at [facility], [address] [place], [place] [address]    Culture GRAM POSITIVE  COCCI  Final   Report Status PENDING  Incomplete  Blood culture (routine x 2)     Status: None (Preliminary result)   Collection Time: 03/11/21  5:46 PM   Specimen: BLOOD  Result Value Ref Range Status   Specimen Description BLOOD RIGHT ANTECUBITAL  Final   Special Requests   Final    BOTTLES DRAWN AEROBIC AND ANAEROBIC Blood Culture adequate volume   Culture   Final    NO GROWTH < 24 HOURS Performed at [facility], [address] [place], [place] [address]    Report Status PENDING  Incomplete  Aerobic Culture w Gram Stain (superficial specimen)     Status: None (Preliminary result)   Collection Time: 03/11/21  5:46 PM   Specimen: Arm  Result Value Ref Range Status   Specimen Description   Final    ARM Performed at [facility], [address] [place], [place] [address]    Special Requests   Final    NONE Performed at [facility], [address] [place], [place] [address]    Gram Stain   Final    NO WBC SEEN NO ORGANISMS SEEN Performed at [facility], [address] [place], [place] [address]    Culture PENDING  Incomplete   Report Status PENDING  Incomplete  Blood Culture ID Panel (Reflexed)     Status: Abnormal   Collection Time: 03/11/21  5:46 PM  Result  Value Ref Range Status   Enterococcus faecalis NOT DETECTED NOT DETECTED Final   Enterococcus Faecium NOT DETECTED NOT DETECTED Final   Listeria monocytogenes NOT DETECTED NOT DETECTED Final   Staphylococcus species DETECTED (A) NOT DETECTED Final    Comment: CRITICAL RESULT CALLED TO, READ BACK BY AND VERIFIED WITH: Algie Coffer, PHARMD AT 0929 ON 03/12/21 BY GM    Staphylococcus aureus (BCID) DETECTED (A) NOT DETECTED Final    Comment: CRITICAL RESULT CALLED TO, READ BACK BY AND VERIFIED WITH: Algie Coffer, PHARMD AT 0929 ON 03/12/21 BY GM    Staphylococcus epidermidis NOT DETECTED NOT DETECTED Final   Staphylococcus lugdunensis NOT DETECTED NOT DETECTED Final    Streptococcus species NOT DETECTED NOT DETECTED Final   Streptococcus agalactiae NOT DETECTED NOT DETECTED Final   Streptococcus pneumoniae NOT DETECTED NOT DETECTED Final   Streptococcus pyogenes NOT DETECTED NOT DETECTED Final   A.calcoaceticus-baumannii NOT DETECTED NOT DETECTED Final   Bacteroides fragilis NOT DETECTED NOT DETECTED Final   Enterobacterales NOT DETECTED NOT DETECTED Final   Enterobacter cloacae complex NOT DETECTED NOT DETECTED Final   Escherichia coli NOT DETECTED NOT DETECTED Final   Klebsiella aerogenes NOT DETECTED NOT DETECTED Final   Klebsiella oxytoca NOT DETECTED NOT DETECTED Final   Klebsiella pneumoniae NOT DETECTED NOT DETECTED Final   Proteus species NOT DETECTED NOT DETECTED Final   Salmonella species NOT DETECTED NOT DETECTED Final   Serratia marcescens NOT DETECTED NOT DETECTED Final   Haemophilus influenzae NOT DETECTED NOT DETECTED Final   Neisseria meningitidis NOT DETECTED NOT DETECTED Final   Pseudomonas aeruginosa NOT DETECTED NOT DETECTED Final   Stenotrophomonas maltophilia NOT DETECTED NOT DETECTED Final   Candida albicans NOT DETECTED NOT DETECTED Final   Candida auris NOT DETECTED NOT DETECTED Final   Candida glabrata NOT DETECTED NOT DETECTED Final   Candida krusei NOT DETECTED NOT DETECTED Final   Candida parapsilosis NOT DETECTED NOT DETECTED Final   Candida tropicalis NOT DETECTED NOT DETECTED Final   Cryptococcus neoformans/gattii NOT DETECTED NOT DETECTED Final   Meth resistant mecA/C and MREJ NOT DETECTED NOT DETECTED Final    Comment: Performed at [facility], [address] [place], [place] [address]  MRSA Next Gen by PCR, Nasal     Status: None   Collection Time: 03/12/21  1:03 AM   Specimen: Nasal Mucosa; Nasal Swab  Result Value Ref Range Status   MRSA by PCR Next Gen NOT DETECTED NOT DETECTED Final    Comment: (NOTE) The GeneXpert MRSA Assay (FDA approved for NASAL specimens only), is one component of a  comprehensive MRSA colonization surveillance program. It is not intended to diagnose MRSA infection nor to guide or monitor treatment for MRSA infections. Test performance is not FDA approved in patients less than 28 years old. Performed at [facility], [address] [place], [place] [address]   Chlamydia/NGC rt PCR [facility] only)     Status: None   Collection Time: 03/12/21  1:55 AM   Specimen: Urine  Result Value Ref Range Status   Specimen source GC/Chlam URINE, RANDOM  Final   Chlamydia Tr NOT DETECTED NOT DETECTED Final   N gonorrhoeae NOT DETECTED NOT DETECTED Final    Comment: (NOTE) This CT/NG assay has not been evaluated in patients with a history of  hysterectomy. Performed at [facility], [address] [place], [place] [address]     Procedures and diagnostic studies:  No results found.  LOS: 1 day   Aldon Hengst  [facility]   Pager on [email or web address]. If 7PM-7AM, please contact night-coverage at [email or web address]     03/12/2021, 4:02 PM

## 2021-03-12 NOTE — Progress Notes (Signed)
Initial Nutrition Assessment  DOCUMENTATION CODES:   Not applicable  INTERVENTION:   Ensure Enlive po TID, each supplement provides 350 kcal and 20 grams of protein  MVI po daily   Liberalize diet   Pt at high refeed risk; recommend monitor potassium, magnesium and phosphorus labs daily until stable  NUTRITION DIAGNOSIS:   Inadequate oral intake related to acute illness as evidenced by per patient/family report.  GOAL:   Patient will meet greater than or equal to 90% of their needs  MONITOR:   PO intake, Supplement acceptance, Labs, Weight trends, Skin, I & O's  REASON FOR ASSESSMENT:   Malnutrition Screening Tool    ASSESSMENT:   20 y.o. male with medical history significant for eczema, ADHD, substance abuse and asthma who is admitted with sepsis secondary to skin lesions and erythema affecting his upper extremities bilaterally.  Met with pt in room today. Pt reports fair appetite and oral intake pta and in hospital. Pt's breakfast tray was sitting on his side table untouched but pt did eat the sandwich off of his lunch tray. Pt has also been eating chips and snacks brought in by his grandmother at bedside. RD discussed with pt the importance of adequate nutrition needed to support healing. Pt is willing to drink chocolate or strawberry Ensure in hospital. RD will add supplements and MVI to help pt meet his estimated needs. RD will also liberalize pt's diet. Pt is likely at refeed risk. Per chart, pt is down 21lbs(10%) since last September; RD unsure how recently weight loss occurred. Pt is not aware that he had lost any weight.   Medications reviewed and include: lovenox, insulin, cefazolin, LRS _0 /hr  Labs reviewed: K 3.7 wnl, Mg 2.2 wnl Cbgs- 105, 117 x 24 hrs AIC 4.9- 9/16  NUTRITION - FOCUSED PHYSICAL EXAM:  Flowsheet Row Most Recent Value  Orbital Region No depletion  Upper Arm Region Mild depletion  Thoracic and Lumbar Region No depletion  Buccal Region  No depletion  Temple Region No depletion  Clavicle Bone Region Mild depletion  Clavicle and Acromion Bone Region Mild depletion  Scapular Bone Region No depletion  Dorsal Hand No depletion  Patellar Region Moderate depletion  Anterior Thigh Region Moderate depletion  Posterior Calf Region Moderate depletion  Edema (RD Assessment) None  Hair Reviewed  Eyes Reviewed  Mouth Reviewed  Skin Reviewed  Nails Reviewed   Diet Order:   Diet Order             Diet heart healthy/carb modified Room service appropriate? Yes; Fluid consistency: Thin  Diet effective now                  EDUCATION NEEDS:   Education needs have been addressed  Skin:  Skin Assessment: Reviewed RN Assessment  Last BM:  9/15  Height:   Ht Readings from Last 1 Encounters:  03/11/21 _1  (1.803 m)    Weight:   Wt Readings from Last 1 Encounters:  03/11/21 82.5 kg    Ideal Body Weight:  78.18 kg  BMI:  Body mass index is 25.37 kg/m.  Estimated Nutritional Needs:   Kcal:  2300-2600kcal/day  Protein:  >115g/day  Fluid:  2.4-2.7L/day  Koleen Distance MS, RD, LDN Please refer to [facility] for RD and/or RD on-call/weekend/after hours pager

## 2021-03-12 NOTE — Consult Note (Signed)
NAME: Randy Henry  DOB: 07/15/2000  MRN: [id number]  Date/Time: 03/12/2021 7:26 PM  REQUESTING PROVIDER ; Dr Myriam Forehand REASON FOR CONSULT: MSSa bacteremia ? Randy Henry is a 20 y.o. male with a history of atopic eczema presents to the ED on 03/11/21 with rash on his arms. He has a h/o IVDA but denies any use in the past 1 month. He reported possible spider bites In the ED vitals HR 141, temp was 98.9 Labs revealed lactate 5.3, WBC 19.7, cr 1.06 Blood culture sent and he was started on broad spectrum Iv antibiotics Monkeypox test was also sent  , urine tox screen positive for amphetamine HIV RPR neg Blood culture came back positive for MSSA an I am seeing the patient for the same  Past Medical History:  Diagnosis Date   ADHD (attention deficit hyperactivity disorder)     History reviewed. No pertinent surgical history.  Social History   Socioeconomic History   Marital status: Single    Spouse name: Not on file   Number of children: Not on file   Years of education: Not on file   Highest education level: Not on file  Occupational History   Not on file  Tobacco Use   Smoking status: Passive Smoke Exposure - Never Smoker   Smokeless tobacco: Never  Substance and Sexual Activity   Alcohol use: No   Drug use: Not on file   Sexual activity: Not on file  Other Topics Concern   Not on file  Social History Narrative   Not on file   Social Determinants of Health   Financial Resource Strain: Not on file  Food Insecurity: Not on file  Transportation Needs: Not on file  Physical Activity: Not on file  Stress: Not on file  Social Connections: Not on file  Intimate Partner Violence: Not on file    Family History  Problem Relation Age of Onset   ADD / ADHD Mother    Cancer Maternal Grandfather    Allergies  Allergen Reactions   Shellfish-Derived Products    I? Current Facility-Administered Medications  Medication Dose Route Frequency Provider Last Rate Last Admin    acetaminophen (TYLENOL) tablet 650 mg  650 mg Oral Q6H PRN Dow Adolph N, DO   650 mg at 03/12/21 0916   ceFAZolin (ANCEF) IVPB 2g/100 mL premix  2 g Intravenous Q8H Lynn Ito, MD 200 mL/hr at 03/12/21 1840 2 g at 03/12/21 1840   diltiazem (CARDIZEM) tablet 30 mg  30 mg Oral Q6H Lurene Shadow, MD   30 mg at 03/12/21 1713   enoxaparin (LOVENOX) injection 40 mg  40 mg Subcutaneous Q24H Dow Adolph N, DO   40 mg at 03/11/21 2122   feeding supplement (ENSURE ENLIVE / ENSURE PLUS) liquid 237 mL  237 mL Oral TID BM Lurene Shadow, MD       insulin aspart (novoLOG) injection 0-5 Units  0-5 Units Subcutaneous QHS Hall, Carole N, DO       insulin aspart (novoLOG) injection 0-9 Units  0-9 Units Subcutaneous TID WC Hall, Carole N, DO       melatonin tablet 2.5 mg  2.5 mg Oral QHS PRN Dow Adolph N, DO       ondansetron (ZOFRAN) injection 4 mg  4 mg Intravenous Q6H PRN Dow Adolph N, DO       oxyCODONE (Oxy IR/ROXICODONE) immediate release tablet 5 mg  5 mg Oral Q4H PRN Dow Adolph N, DO   5 mg at 03/12/21  0207   polyethylene glycol (MIRALAX / GLYCOLAX) packet 17 g  17 g Oral Daily PRN Dow Adolph N, DO       sertraline (ZOLOFT) tablet 50 mg  50 mg Oral Daily Dow Adolph N, DO   50 mg at 03/12/21 0847   triamcinolone ointment (KENALOG) 0.1 %   Topical TID Darlin Drop, DO   Given at 03/12/21 4193     Abtx:  Anti-infectives (From admission, onward)    Start     Dose/Rate Route Frequency Ordered Stop   03/12/21 1200  vancomycin (VANCOREADY) IVPB 1750 mg/350 mL  Status:  Discontinued        1,750 mg 175 mL/hr over 120 Minutes Intravenous Every 12 hours 03/12/21 0742 03/12/21 1006   03/12/21 1100  vancomycin (VANCOREADY) IVPB 1500 mg/300 mL  Status:  Discontinued        1,500 mg 150 mL/hr over 120 Minutes Intravenous Every 12 hours 03/11/21 2150 03/12/21 0742   03/12/21 1100  ceFAZolin (ANCEF) IVPB 2g/100 mL premix        2 g 200 mL/hr over 30 Minutes Intravenous Every 8 hours  03/12/21 1006     03/12/21 0000  ceFEPIme (MAXIPIME) 2 g in sodium chloride 0.9 % 100 mL IVPB  Status:  Discontinued        2 g 200 mL/hr over 30 Minutes Intravenous Every 8 hours 03/11/21 2037 03/12/21 1006   03/11/21 2200  vancomycin (VANCOCIN) IVPB 1000 mg/200 mL premix        1,000 mg 200 mL/hr over 60 Minutes Intravenous  Once 03/11/21 2037 03/12/21 0154   03/11/21 1745  ceFEPIme (MAXIPIME) 2 g in sodium chloride 0.9 % 100 mL IVPB        2 g 200 mL/hr over 30 Minutes Intravenous  Once 03/11/21 1733 03/11/21 1831   03/11/21 1745  metroNIDAZOLE (FLAGYL) IVPB 500 mg        500 mg 100 mL/hr over 60 Minutes Intravenous  Once 03/11/21 1733 03/11/21 1942   03/11/21 1745  vancomycin (VANCOCIN) IVPB 1000 mg/200 mL premix        1,000 mg 200 mL/hr over 60 Minutes Intravenous  Once 03/11/21 1733 03/11/21 1907       REVIEW OF SYSTEMS:  Const: negative fever, negative chills, negative weight loss Eyes: negative diplopia or visual changes, negative eye pain ENT: negative coryza, negative sore throat Resp: negative cough, hemoptysis, dyspnea Cards: negative for chest pain, palpitations, lower extremity edema GU: negative for frequency, dysuria and hematuria GI: Negative for abdominal pain, diarrhea, bleeding, constipation Skin: itchy and pruritic skin Heme: negative for easy bruising and gum/nose bleeding MS: negative for myalgias, arthralgias, back pain and muscle weakness Neurolo:negative for headaches, dizziness, vertigo, memory problems  Psych: restless Endocrine: negative for thyroid, diabetes Allergy/Immunology- negative for any medication or food allergies ?  Objective:  VITALS:  BP 140/73 (BP Location: Left Arm)   Pulse (!) 102 Comment: RN Vernona Rieger notified  Temp 100.1 F (37.8 C) (Oral) Comment: RN Vernona Rieger notified  Resp 16   Ht 5\' 11"  (1.803 m)   Wt 82.5 kg   SpO2 100%   BMI 25.37 kg/m  PHYSICAL EXAM:  General: Alert, cooperative, restless, skin itchy over face Head:  Normocephalic, without obvious abnormality, atraumatic. Eyes: Conjunctivae clear, anicteric sclerae. Pupils are equal ENT Nares normal. No drainage or sinus tenderness. Lips, mucosa, and tongue normal. No Thrush Neck: Supple, symmetrical, no adenopathy, thyroid: non tender no carotid bruit and no JVD. Back: No  CVA tenderness. Lungs: Clear to auscultation bilaterally. No Wheezing or Rhonchi. No rales. Heart: Regular rate and rhythm, no murmur, rub or gallop. Abdomen: Soft, non-tender,not distended. Bowel sounds normal. No masses Extremities: eschar and scabbed wounds over arms      Skin: erythematous papular amcualr rash over face/chest Lymph: Cervical, supraclavicular normal. Neurologic: Grossly non-focal Pertinent Labs Lab Results CBC    Component Value Date/Time   WBC 8.5 03/12/2021 0436   RBC 4.63 03/12/2021 0436   HGB 14.0 03/12/2021 0436   HCT 39.8 03/12/2021 0436   PLT 242 03/12/2021 0436   MCV 86.0 03/12/2021 0436   MCH 30.2 03/12/2021 0436   MCHC 35.2 03/12/2021 0436   RDW 12.0 03/12/2021 0436   LYMPHSABS 1.2 03/11/2021 1545   MONOABS 1.0 03/11/2021 1545   EOSABS 0.3 03/11/2021 1545   BASOSABS 0.1 03/11/2021 1545    CMP Latest Ref Rng & Units 03/12/2021 03/11/2021 04/08/2018  Glucose 70 - 99 mg/dL 388(E) 280(K) 349(Z)  BUN 6 - 20 mg/dL 9 13 8   Creatinine 0.61 - 1.24 mg/dL 7.91 5.05  Sodium 135 - 145 mmol/L 136 135 140  Potassium 3.5 - 5.1 mmol/L 3.7 3.4(L) 3.6  Chloride 98 - 111 mmol/L 103 99 104  CO2 22 - 32 mmol/L 27 21(L) 27  Calcium 8.9 - 10.3 mg/dL 6.97) 9.6 9.4(I)  Total Protein 6.5 - 8.1 g/dL - 7.8 6.9  Total Bilirubin 0.3 - 1.2 mg/dL - 0.9 0.9  Alkaline Phos 38 - 126 U/L - 80 101  AST 15 - 41 U/L - 40 24  ALT 0 - 44 U/L - 52(H) 33      Microbiology: Recent Results (from the past 240 hour(s))  Blood culture (routine x 2)     Status: None (Preliminary result)   Collection Time: 03/11/21  5:46 PM   Specimen: BLOOD  Result Value Ref Range  Status   Specimen Description BLOOD LEFT ANTECUBITAL  Final   Special Requests   Final    BOTTLES DRAWN AEROBIC AND ANAEROBIC Blood Culture adequate volume   Culture  Setup Time   Final    GRAM POSITIVE COCCI IN BOTH AEROBIC AND ANAEROBIC BOTTLES Organism ID to follow CRITICAL RESULT CALLED TO, READ BACK BY AND VERIFIED WITH: 03/13/21, PHARMD AT Algie Coffer ON 03/12/21 BY GM Performed at [facility], [address] [place], [place]    Culture GRAM POSITIVE COCCI  Final   Report Status PENDING  Incomplete  Blood culture (routine x 2)     Status: None (Preliminary result)   Collection Time: 03/11/21  5:46 PM   Specimen: BLOOD  Result Value Ref Range Status   Specimen Description BLOOD RIGHT ANTECUBITAL  Final   Special Requests   Final    BOTTLES DRAWN AEROBIC AND ANAEROBIC Blood Culture adequate volume   Culture   Final    NO GROWTH < 24 HOURS Performed at [facility], [address] [place], [place]    Report Status PENDING  Incomplete  Aerobic Culture w Gram Stain (superficial specimen)     Status: None (Preliminary result)   Collection Time: 03/11/21  5:46 PM   Specimen: Arm  Result Value Ref Range Status   Specimen Description   Final    ARM Performed at [facility], [address] [place], [place]    Special Requests   Final    NONE Performed at [facility], [address], [place], [place]  Gram Stain   Final    NO WBC SEEN NO ORGANISMS SEEN Performed at [facility], [address], [place], [place] [address]    Culture PENDING  Incomplete   Report Status PENDING  Incomplete  Blood Culture ID Panel (Reflexed)     Status: Abnormal   Collection Time: 03/11/21  5:46 PM  Result Value Ref Range Status   Enterococcus faecalis NOT DETECTED NOT DETECTED Final   Enterococcus Faecium NOT DETECTED NOT DETECTED Final   Listeria monocytogenes NOT DETECTED NOT DETECTED Final    Staphylococcus species DETECTED (A) NOT DETECTED Final    Comment: CRITICAL RESULT CALLED TO, READ BACK BY AND VERIFIED WITH: Algie Coffer, PHARMD AT 0929 ON 03/12/21 BY GM    Staphylococcus aureus (BCID) DETECTED (A) NOT DETECTED Final    Comment: CRITICAL RESULT CALLED TO, READ BACK BY AND VERIFIED WITH: Algie Coffer, PHARMD AT 0929 ON 03/12/21 BY GM    Staphylococcus epidermidis NOT DETECTED NOT DETECTED Final   Staphylococcus lugdunensis NOT DETECTED NOT DETECTED Final   Streptococcus species NOT DETECTED NOT DETECTED Final   Streptococcus agalactiae NOT DETECTED NOT DETECTED Final   Streptococcus pneumoniae NOT DETECTED NOT DETECTED Final   Streptococcus pyogenes NOT DETECTED NOT DETECTED Final   A.calcoaceticus-baumannii NOT DETECTED NOT DETECTED Final   Bacteroides fragilis NOT DETECTED NOT DETECTED Final   Enterobacterales NOT DETECTED NOT DETECTED Final   Enterobacter cloacae complex NOT DETECTED NOT DETECTED Final   Escherichia coli NOT DETECTED NOT DETECTED Final   Klebsiella aerogenes NOT DETECTED NOT DETECTED Final   Klebsiella oxytoca NOT DETECTED NOT DETECTED Final   Klebsiella pneumoniae NOT DETECTED NOT DETECTED Final   Proteus species NOT DETECTED NOT DETECTED Final   Salmonella species NOT DETECTED NOT DETECTED Final   Serratia marcescens NOT DETECTED NOT DETECTED Final   Haemophilus influenzae NOT DETECTED NOT DETECTED Final   Neisseria meningitidis NOT DETECTED NOT DETECTED Final   Pseudomonas aeruginosa NOT DETECTED NOT DETECTED Final   Stenotrophomonas maltophilia NOT DETECTED NOT DETECTED Final   Candida albicans NOT DETECTED NOT DETECTED Final   Candida auris NOT DETECTED NOT DETECTED Final   Candida glabrata NOT DETECTED NOT DETECTED Final   Candida krusei NOT DETECTED NOT DETECTED Final   Candida parapsilosis NOT DETECTED NOT DETECTED Final   Candida tropicalis NOT DETECTED NOT DETECTED Final   Cryptococcus neoformans/gattii NOT DETECTED NOT DETECTED Final    Meth resistant mecA/C and MREJ NOT DETECTED NOT DETECTED Final    Comment: Performed at [facility], [address] [place], [place] [address]  MRSA Next Gen by PCR, Nasal     Status: None   Collection Time: 03/12/21  1:03 AM   Specimen: Nasal Mucosa; Nasal Swab  Result Value Ref Range Status   MRSA by PCR Next Gen NOT DETECTED NOT DETECTED Final    Comment: (NOTE) The GeneXpert MRSA Assay (FDA approved for NASAL specimens only), is one component of a comprehensive MRSA colonization surveillance program. It is not intended to diagnose MRSA infection nor to guide or monitor treatment for MRSA infections. Test performance is not FDA approved in patients less than 70 years old. Performed at [facility], [address] [place], [place] [address]   Chlamydia/NGC rt PCR [facility] only)     Status: None   Collection Time: 03/12/21  1:55 AM   Specimen: Urine  Result Value Ref Range Status   Specimen source GC/Chlam URINE, RANDOM  Final   Chlamydia Tr NOT  DETECTED NOT DETECTED Final   N gonorrhoeae NOT DETECTED NOT DETECTED Final    Comment: (NOTE) This CT/NG assay has not been evaluated in patients with a history of  hysterectomy. Performed at [facility], [address] [place], [place] [address]     IMAGING RESULTS: CXR N I have personally reviewed the films ? Impression/Recommendation ? MSSA bacteremia- DC vanco and cefepime an dchange to cefazolin 2 grams IV Q 8 Will need 2 d echo and repeat blood culture Denies recent IVDA  Skin lesions - infected wounds with scabs- likely from staph, and with amphetamine use could have been picking skin Monkeypox unlikely- DNA test  has been sent - because he has underlying atopic eczema If neg then we can Dc airborne  Substance use ? ?HIV/RPR negative ___________________________________________________ Discussed with patient, requesting provider ID will follow him peripherally this weekend- call if  needed Note:  This document was prepared using Dragon voice recognition software and may include unintentional dictation errors.

## 2021-03-12 NOTE — TOC Progression Note (Signed)
Transition of Care [facility]) - Progression Note    Patient Details  Name: Randy Henry MRN: [id number] Date of Birth: [birth year]  Transition of [facility]) CM/SW Contact  Allayne Butcher, RN Phone Number: 03/12/2021, 1:49 PM  Clinical Narrative:    Patient admitted to the hospital with sepsis, patient also has COVID.  Patient's grandmother is here visiting with patient.  Grandmother requested that RNCM come and speak with her about substance abuse resources.   Grandmother reports that patient is heavy into drugs not exactly sure which drugs except for meth and she reports he snorted prednisone the other night.   She is going to try and speak with him about getting treatment, she would like to see him get in to a program like [facility].  RNCM provided her with information on [facility] and others in the state as well as resources for local and outpatient substance abuse treatment.    Grandmother also reports that patient is homeless has he has not been working and paying for housing so he was evicted and his mother packed up his belongings yesterday at the place he was staying.   Patient's family will not allow him to live with them at [place].          Expected Discharge Plan and Services                                                 Social Determinants of Health (SDOH) Interventions    Readmission Risk Interventions No flowsheet data found.

## 2021-03-12 NOTE — Progress Notes (Addendum)
Patient HR 130's to 140's. Walked into room and he was sitting on the side of the bed. Stated that he was hot. Heart rhythm in SVT during shift. Advised patient to bear down and rhythm resolved to ST. Family concerned about irregular heart rhythms and requested to check for endocarditis. Notified hospitalist. Admin Cardizem 30mg  tab per order. Second episode of HR in 130's and 140's at end of shift. Patient was sitting on the side of the bed again when entering the room. Hospitalist notified. HR 100's when lying down.

## 2021-03-13 DIAGNOSIS — I471 Supraventricular tachycardia: Secondary | ICD-10-CM | POA: Diagnosis not present

## 2021-03-13 DIAGNOSIS — R7881 Bacteremia: Secondary | ICD-10-CM | POA: Diagnosis present

## 2021-03-13 DIAGNOSIS — A4101 Sepsis due to Methicillin susceptible Staphylococcus aureus: Secondary | ICD-10-CM | POA: Diagnosis not present

## 2021-03-13 DIAGNOSIS — B9561 Methicillin susceptible Staphylococcus aureus infection as the cause of diseases classified elsewhere: Secondary | ICD-10-CM | POA: Diagnosis not present

## 2021-03-13 LAB — BASIC METABOLIC PANEL
Anion gap: 7 (ref 5–15)
BUN: 5 mg/dL — ABNORMAL LOW (ref 6–20)
CO2: 30 mmol/L (ref 22–32)
Calcium: 8.7 mg/dL — ABNORMAL LOW (ref 8.9–10.3)
Chloride: 102 mmol/L (ref 98–111)
Creatinine, Ser: 0.99 mg/dL (ref 0.61–1.24)
GFR, Estimated: >60 mL/min (ref >60)
Glucose, Bld: 84 mg/dL (ref 70–99)
Potassium: 3.6 mmol/L (ref 3.5–5.1)
Sodium: 139 mmol/L (ref 135–145)

## 2021-03-13 LAB — GLUCOSE, CAPILLARY
Glucose-Capillary: 108 mg/dL — ABNORMAL HIGH (ref 70–99)
Glucose-Capillary: 142 mg/dL — ABNORMAL HIGH (ref 70–99)
Glucose-Capillary: 110 mg/dL — ABNORMAL HIGH (ref 70–99)
Glucose-Capillary: 155 mg/dL — ABNORMAL HIGH (ref 70–99)
Glucose-Capillary: 132 mg/dL — ABNORMAL HIGH (ref 70–99)

## 2021-03-13 LAB — MAGNESIUM: Magnesium: 2.2 mg/dL (ref 1.7–2.4)

## 2021-03-13 MED ORDER — POTASSIUM CHLORIDE CRYS ER 20 MEQ PO TBCR
40.0000 meq | EXTENDED_RELEASE_TABLET | Freq: Once | ORAL | Status: AC
  Administered 2021-03-13: 40 meq via ORAL
  Filled 2021-03-13: qty 2

## 2021-03-13 MED ORDER — SODIUM CHLORIDE 0.9 % IV SOLN
INTRAVENOUS | Status: DC | PRN
  Administered 2021-03-13 – 2021-03-15 (×2): 250 mL via INTRAVENOUS

## 2021-03-13 MED ORDER — DILTIAZEM HCL ER COATED BEADS 120 MG PO CP24
120.0000 mg | ORAL_CAPSULE | Freq: Every day | ORAL | Status: DC
  Administered 2021-03-13 – 2021-03-25 (×13): 120 mg via ORAL
  Filled 2021-03-13 (×13): qty 1

## 2021-03-13 NOTE — Progress Notes (Addendum)
Held scheduled cardizem 30mg  tab @ 1307 due to NSR. Patient HR SVT @ 1536 last 1.5 min per tele. Entered patient room and he is sweating, tremoring and slumping forward with blood running down his arm. He removed his IV access in rt arm and stated that his arm was burning. I replaced his telemetry and HR in 140's. Remained in ST 130's-140's for 2-3 min. Charge reported activity to hospitalist. Assisted security in performing room/patient search per hospitalist verbal order.  Removed 2 vapes from room and are held in patient chart.   Admin Cardizem 120mg  @ 1601 per order. Educated patient on risk of substance use. Will continue to monitor.

## 2021-03-13 NOTE — Progress Notes (Addendum)
Progress Note    Randy Henry  QVZ:[id number] DOB: 03-01-2001  DOA: 03/11/2021 PCP: Randy Poling, MD      Brief Narrative:    Medical records reviewed and are as summarized below:  Randy Henry is a 20 y.o. male with medical history significant for COVID-19 infection in January 2022, atopic eczema, ADHD, substance use disorder, asthma, who presented to the hospital because of multiple skin lesions on his upper extremities.      Assessment/Plan:   Principal Problem:   Sepsis (HCC) Active Problems:   Bacteremia due to Staphylococcus aureus   SVT (supraventricular tachycardia) (HCC)   Nutrition Problem: Inadequate oral intake Etiology: acute illness  Signs/Symptoms: per patient/family report   Body mass index is 25.37 kg/m.   Severe sepsis secondary to MSSA bacteremia and ?left upper extremity cellulitis: Lactic acid was 5.3 on admission.  Continue IV cefazolin.  2D echo is pending.  No acute abnormality on chest x-ray.  Follow-up with ID for further recommendations.  Multiple lesions/rash on bilateral upper extremities, face and chest: Etiology is unclear.  Monkey pox viral DNA test is pending.  Continue airborne and contact precautions for now.  S/p SVT: Heart rate is better.  Change short acting Cardizem to long-acting Cardizem.  Monitor on telemetry.   Hypokalemia: Improved.  Continue potassium repletion to keep level after 4 or more.  ADHD: He is on Evekeo at home.  Substance use disorder: Patient said he uses cocaine and CBD Gummies.  He said he last used cocaine about a week prior to admission.  He said he does not use methadone but his cocaine may have been laced with methadone.  He said he does not use any IV drugs.  Follow-up with case manager/social worker to assist with outpatient resources.     Diet Order             Diet regular Room service appropriate? Yes; Fluid consistency: Thin  Diet effective now                       Consultants: Infectious disease  Procedures: None    Medications:    diltiazem  30 mg Oral Q6H   enoxaparin (LOVENOX) injection  40 mg Subcutaneous Q24H   feeding supplement  237 mL Oral TID BM   insulin aspart  0-5 Units Subcutaneous QHS   insulin aspart  0-9 Units Subcutaneous TID WC   potassium chloride  40 mEq Oral Once   sertraline  50 mg Oral Daily   triamcinolone ointment   Topical TID   Continuous Infusions:  sodium chloride Stopped (03/13/21 0345)    ceFAZolin (ANCEF) IV 2 g (03/13/21 1109)     Anti-infectives (From admission, onward)    Start     Dose/Rate Route Frequency Ordered Stop   03/12/21 1200  vancomycin (VANCOREADY) IVPB 1750 mg/350 mL  Status:  Discontinued        1,750 mg 175 mL/hr over 120 Minutes Intravenous Every 12 hours 03/12/21 0742 03/12/21 1006   03/12/21 1100  vancomycin (VANCOREADY) IVPB 1500 mg/300 mL  Status:  Discontinued        1,500 mg 150 mL/hr over 120 Minutes Intravenous Every 12 hours 03/11/21 2150 03/12/21 0742   03/12/21 1100  ceFAZolin (ANCEF) IVPB 2g/100 mL premix        2 g 200 mL/hr over 30 Minutes Intravenous Every 8 hours 03/12/21 1006     03/12/21 0000  ceFEPIme (MAXIPIME)  2 g in sodium chloride 0.9 % 100 mL IVPB  Status:  Discontinued        2 g 200 mL/hr over 30 Minutes Intravenous Every 8 hours 03/11/21 2037 03/12/21 1006   03/11/21 2200  vancomycin (VANCOCIN) IVPB 1000 mg/200 mL premix        1,000 mg 200 mL/hr over 60 Minutes Intravenous  Once 03/11/21 2037 03/12/21 0154   03/11/21 1745  ceFEPIme (MAXIPIME) 2 g in sodium chloride 0.9 % 100 mL IVPB        2 g 200 mL/hr over 30 Minutes Intravenous  Once 03/11/21 1733 03/11/21 1831   03/11/21 1745  metroNIDAZOLE (FLAGYL) IVPB 500 mg        500 mg 100 mL/hr over 60 Minutes Intravenous  Once 03/11/21 1733 03/11/21 1942   03/11/21 1745  vancomycin (VANCOCIN) IVPB 1000 mg/200 mL premix        1,000 mg 200 mL/hr over 60 Minutes Intravenous  Once 03/11/21 1733  03/11/21 1907              Family Communication/Anticipated D/C date and plan/Code Status   DVT prophylaxis: enoxaparin (LOVENOX) injection 40 mg Start: 03/11/21 2000 SCDs Start: 03/11/21 1939     Code Status: Full Code  Family Communication: None Disposition Plan:    Status is: Inpatient  Remains inpatient appropriate because:IV treatments appropriate due to intensity of illness or inability to take PO  Dispo:  Patient From: Home  Planned Disposition: Home  Medically stable for discharge: No             Subjective:   Interval events noted.  He said he has had a rash for about 2 weeks.  However he noticed to continue lesions on the left upper extremity (one on the arm and one on the forearm).  He then developed swelling and tenderness of the left.  Objective:    Vitals:   03/12/21 0851 03/12/21 2024 03/13/21 0342 03/13/21 0809  BP: 140/73 (!) 150/72 (!) 148/74 124/74  Pulse: (!) 102 (!) 102 (!) 107 86  Resp: 16 16 16 17   Temp: 100.1 F (37.8 C) 99.3 F (37.4 C) 97.7 F (36.5 C) 98.6 F (37 C)  TempSrc: Oral Oral Oral   SpO2: 100% 98% 100% 98%  Weight:      Height:       No data found.   Intake/Output Summary (Last 24 hours) at 03/13/2021 1319 Last data filed at 03/13/2021 0614 Gross per 24 hour  Intake 860.73 ml  Output 0 ml  Net 860.73 ml   Filed Weights   03/11/21 1804 03/11/21 2317  Weight: 89.8 kg 82.5 kg    Exam:  GEN: NAD SKIN: Multiple hyperpigmented lesions/scabbed wounds on bilateral upper extremities.  He also has lesions on the face and chest. EYES: EOMI ENT: MMM CV: RRR PULM: CTA B ABD: soft, ND, NT, +BS CNS: AAO x 3, non focal EXT: No edema or tenderness         Data Reviewed:   I have personally reviewed following labs and imaging studies:  Labs: Labs show the following:   Basic Metabolic Panel: Recent Labs  Lab 03/11/21 1545 03/11/21 1746 03/12/21 0436 03/13/21 0444  NA 135  --  136 139  K  3.4*  --  3.7 3.6  CL 99  --  103 102  CO2 21*  --  27 30  GLUCOSE 167*  --  107* 84  BUN 13  --  9 5*  CREATININE 1.06  --  0.94 0.99  CALCIUM 9.6  --  8.7* 8.7*  MG  --  2.2  --  2.2   GFR Estimated Creatinine Clearance: 126.8 mL/min (by C-G formula based on SCr of 0.99 mg/dL). Liver Function Tests: Recent Labs  Lab 03/11/21 1545  AST 40  ALT 52*  ALKPHOS 80  BILITOT 0.9  PROT 7.8  ALBUMIN 4.7   No results for input(s): LIPASE, AMYLASE in the last 168 hours. No results for input(s): AMMONIA in the last 168 hours. Coagulation profile No results for input(s): INR, PROTIME in the last 168 hours.  CBC: Recent Labs  Lab 03/11/21 1545 03/12/21 0436  WBC 19.7* 8.5  NEUTROABS 17.0*  --   HGB 16.6 14.0  HCT 44.8 39.8  MCV 84.7 86.0  PLT 337 242   Cardiac Enzymes: No results for input(s): CKTOTAL, CKMB, CKMBINDEX, TROPONINI in the last 168 hours. BNP (last 3 results) No results for input(s): PROBNP in the last 8760 hours. CBG: Recent Labs  Lab 03/12/21 1652 03/12/21 2026 03/13/21 0845 03/13/21 1101 03/13/21 1209  GLUCAP 104* 99 108* 142* 110*   D-Dimer: No results for input(s): DDIMER in the last 72 hours. Hgb A1c: Recent Labs    03/12/21 0436  HGBA1C 4.9   Lipid Profile: No results for input(s): CHOL, HDL, LDLCALC, TRIG, CHOLHDL, LDLDIRECT in the last 72 hours. Thyroid function studies: Recent Labs    03/11/21 1746  TSH 0.592   Anemia work up: No results for input(s): VITAMINB12, FOLATE, FERRITIN, TIBC, IRON, RETICCTPCT in the last 72 hours. Sepsis Labs: Recent Labs  Lab 03/11/21 1545 03/11/21 1746 03/12/21 0436  WBC 19.7*  --  8.5  LATICACIDVEN 5.3* 3.6* 0.8    Microbiology Recent Results (from the past 240 hour(s))  Blood culture (routine x 2)     Status: Abnormal (Preliminary result)   Collection Time: 03/11/21  5:46 PM   Specimen: BLOOD  Result Value Ref Range Status   Specimen Description   Final    BLOOD LEFT  ANTECUBITAL Performed at [facility], [address] [place], [place] [address]    Special Requests   Final    BOTTLES DRAWN AEROBIC AND ANAEROBIC Blood Culture adequate volume Performed at [facility], [address], [place], [place] [address]    Culture  Setup Time   Final    GRAM POSITIVE COCCI IN BOTH AEROBIC AND ANAEROBIC BOTTLES Organism ID to follow CRITICAL RESULT CALLED TO, READ BACK BY AND VERIFIED WITH: Algie Coffer, PHARMD AT 6606 ON 03/12/21 BY GM Performed at [facility], [address] [place], [place] [address]    Culture (A)  Final    STAPHYLOCOCCUS AUREUS SUSCEPTIBILITIES TO FOLLOW Performed at [facility], [address] [place], [place] [address]    Report Status PENDING  Incomplete  Blood culture (routine x 2)     Status: None (Preliminary result)   Collection Time: 03/11/21  5:46 PM   Specimen: BLOOD  Result Value Ref Range Status   Specimen Description BLOOD RIGHT ANTECUBITAL  Final   Special Requests   Final    BOTTLES DRAWN AEROBIC AND ANAEROBIC Blood Culture adequate volume   Culture   Final    NO GROWTH 2 DAYS Performed at [facility], [address] [place], [place] [address]    Report Status PENDING  Incomplete  Aerobic Culture w Gram Stain (superficial specimen)     Status: None (Preliminary result)  Collection Time: 03/11/21  5:46 PM   Specimen: Arm  Result Value Ref Range Status   Specimen Description   Final    ARM Performed at [facility], [address] [place], [place] [address]    Special Requests   Final    NONE Performed at [facility], [address] [place], [place] [address]    Gram Stain NO WBC SEEN NO ORGANISMS SEEN   Final   Culture   Final    CULTURE REINCUBATED FOR BETTER GROWTH Performed at [facility], [address] [place], [place] [address]    Report Status PENDING  Incomplete  Blood Culture ID Panel (Reflexed)     Status:  Abnormal   Collection Time: 03/11/21  5:46 PM  Result Value Ref Range Status   Enterococcus faecalis NOT DETECTED NOT DETECTED Final   Enterococcus Faecium NOT DETECTED NOT DETECTED Final   Listeria monocytogenes NOT DETECTED NOT DETECTED Final   Staphylococcus species DETECTED (A) NOT DETECTED Final    Comment: CRITICAL RESULT CALLED TO, READ BACK BY AND VERIFIED WITH: Algie Coffer, PHARMD AT 0929 ON 03/12/21 BY GM    Staphylococcus aureus (BCID) DETECTED (A) NOT DETECTED Final    Comment: CRITICAL RESULT CALLED TO, READ BACK BY AND VERIFIED WITH: Algie Coffer, PHARMD AT 0929 ON 03/12/21 BY GM    Staphylococcus epidermidis NOT DETECTED NOT DETECTED Final   Staphylococcus lugdunensis NOT DETECTED NOT DETECTED Final   Streptococcus species NOT DETECTED NOT DETECTED Final   Streptococcus agalactiae NOT DETECTED NOT DETECTED Final   Streptococcus pneumoniae NOT DETECTED NOT DETECTED Final   Streptococcus pyogenes NOT DETECTED NOT DETECTED Final   A.calcoaceticus-baumannii NOT DETECTED NOT DETECTED Final   Bacteroides fragilis NOT DETECTED NOT DETECTED Final   Enterobacterales NOT DETECTED NOT DETECTED Final   Enterobacter cloacae complex NOT DETECTED NOT DETECTED Final   Escherichia coli NOT DETECTED NOT DETECTED Final   Klebsiella aerogenes NOT DETECTED NOT DETECTED Final   Klebsiella oxytoca NOT DETECTED NOT DETECTED Final   Klebsiella pneumoniae NOT DETECTED NOT DETECTED Final   Proteus species NOT DETECTED NOT DETECTED Final   Salmonella species NOT DETECTED NOT DETECTED Final   Serratia marcescens NOT DETECTED NOT DETECTED Final   Haemophilus influenzae NOT DETECTED NOT DETECTED Final   Neisseria meningitidis NOT DETECTED NOT DETECTED Final   Pseudomonas aeruginosa NOT DETECTED NOT DETECTED Final   Stenotrophomonas maltophilia NOT DETECTED NOT DETECTED Final   Candida albicans NOT DETECTED NOT DETECTED Final   Candida auris NOT DETECTED NOT DETECTED Final   Candida glabrata NOT  DETECTED NOT DETECTED Final   Candida krusei NOT DETECTED NOT DETECTED Final   Candida parapsilosis NOT DETECTED NOT DETECTED Final   Candida tropicalis NOT DETECTED NOT DETECTED Final   Cryptococcus neoformans/gattii NOT DETECTED NOT DETECTED Final   Meth resistant mecA/C and MREJ NOT DETECTED NOT DETECTED Final    Comment: Performed at [facility], [address] [place], [place] [address]  MRSA Next Gen by PCR, Nasal     Status: None   Collection Time: 03/12/21  1:03 AM   Specimen: Nasal Mucosa; Nasal Swab  Result Value Ref Range Status   MRSA by PCR Next Gen NOT DETECTED NOT DETECTED Final    Comment: (NOTE) The GeneXpert MRSA Assay (FDA approved for NASAL specimens only), is one component of a comprehensive MRSA colonization surveillance program. It is not intended to diagnose MRSA infection nor to guide or monitor treatment for MRSA  infections. Test performance is not FDA approved in patients less than 29 years old. Performed at [facility], [address] [place], [place] [address]   Chlamydia/NGC rt PCR [facility] only)     Status: None   Collection Time: 03/12/21  1:55 AM   Specimen: Urine  Result Value Ref Range Status   Specimen source GC/Chlam URINE, RANDOM  Final   Chlamydia Tr NOT DETECTED NOT DETECTED Final   N gonorrhoeae NOT DETECTED NOT DETECTED Final    Comment: (NOTE) This CT/NG assay has not been evaluated in patients with a history of  hysterectomy. Performed at [facility], [address] [place], [place] [address]   CULTURE, BLOOD (ROUTINE X 2) w Reflex to ID Panel     Status: None (Preliminary result)   Collection Time: 03/13/21 12:11 AM   Specimen: BLOOD  Result Value Ref Range Status   Specimen Description BLOOD BLOOD LEFT FOREARM  Final   Special Requests   Final    BOTTLES DRAWN AEROBIC AND ANAEROBIC Blood Culture adequate volume   Culture   Final    NO GROWTH < 12 HOURS Performed at [facility], [address] [place], [place] [address]    Report Status PENDING  Incomplete  CULTURE, BLOOD (ROUTINE X 2) w Reflex to ID Panel     Status: None (Preliminary result)   Collection Time: 03/13/21 12:11 AM   Specimen: BLOOD  Result Value Ref Range Status   Specimen Description BLOOD BLOOD LEFT HAND  Final   Special Requests   Final    BOTTLES DRAWN AEROBIC AND ANAEROBIC Blood Culture adequate volume   Culture   Final    NO GROWTH < 12 HOURS Performed at [facility], [address], [place], [place] [address]    Report Status PENDING  Incomplete    Procedures and diagnostic studies:  DG Chest 2 View  Result Date: 03/12/2021 CLINICAL DATA:  Sepsis COVID EXAM: CHEST - 2 VIEW COMPARISON:  09/22/2010 FINDINGS: The heart size and mediastinal contours are within normal limits. Both lungs are clear. The visualized skeletal structures are unremarkable. IMPRESSION: No active cardiopulmonary disease. Electronically Signed   By: Jasmine Pang M.D.   On: 03/12/2021 18:14               LOS: 2 days   Linna Thebeau  [facility]   Pager on [email or web address]. If 7PM-7AM, please contact night-coverage at [email or web address]     03/13/2021, 1:19 PM

## 2021-03-14 ENCOUNTER — Inpatient Hospital Stay (HOSPITAL_COMMUNITY)
Admit: 2021-03-14 | Discharge: 2021-03-14 | Disposition: A | Discharge status: Home / Self Care | Payer: Medicaid Other | Attending: Internal Medicine | Admitting: Internal Medicine

## 2021-03-14 DIAGNOSIS — A4101 Sepsis due to Methicillin susceptible Staphylococcus aureus: Secondary | ICD-10-CM | POA: Diagnosis not present

## 2021-03-14 DIAGNOSIS — R7881 Bacteremia: Secondary | ICD-10-CM

## 2021-03-14 DIAGNOSIS — B9561 Methicillin susceptible Staphylococcus aureus infection as the cause of diseases classified elsewhere: Secondary | ICD-10-CM | POA: Diagnosis not present

## 2021-03-14 LAB — CULTURE, BLOOD (ROUTINE X 2): Special Requests: ADEQUATE

## 2021-03-14 LAB — AEROBIC CULTURE W GRAM STAIN (SUPERFICIAL SPECIMEN): Gram Stain: NONE SEEN

## 2021-03-14 LAB — GLUCOSE, CAPILLARY
Glucose-Capillary: 88 mg/dL (ref 70–99)
Glucose-Capillary: 100 mg/dL — ABNORMAL HIGH (ref 70–99)
Glucose-Capillary: 131 mg/dL — ABNORMAL HIGH (ref 70–99)

## 2021-03-14 LAB — ECHOCARDIOGRAM COMPLETE
AR max vel: 4.44 cm2
AV Peak grad: 6.4 mmHg
Ao pk vel: 1.26 m/s
Area-P 1/2: 3.3 cm2
Height: 71 in
S' Lateral: 3.4 cm
Weight: 2910.07 oz

## 2021-03-14 NOTE — Progress Notes (Signed)
PHARMACY - PHYSICIAN COMMUNICATION CRITICAL VALUE ALERT - BLOOD CULTURE IDENTIFICATION (BCID)  Randy Henry is an 20 y.o. male who presented to [facility] on 03/11/2021 with a chief complaint of skin lesions and erythema.   Assessment: presented to [facility] ED due to sudden onset of worsening skin lesions and erythema affecting upper extremities bilaterally.    Name of physician (or Provider) Contacted: Dr. Lurene Shadow  Current antibiotics: cefazolin 2 grams every 8 hours  Changes to prescribed antibiotics recommended:  Patient is on recommended antibiotics - No changes needed  Results for orders placed or performed during the hospital encounter of 03/11/21  Blood Culture ID Panel (Reflexed) (Collected: 03/11/2021  5:46 PM)  Result Value Ref Range   Enterococcus faecalis NOT DETECTED NOT DETECTED   Enterococcus Faecium NOT DETECTED NOT DETECTED   Listeria monocytogenes NOT DETECTED NOT DETECTED   Staphylococcus species DETECTED (A) NOT DETECTED   Staphylococcus aureus (BCID) DETECTED (A) NOT DETECTED   Staphylococcus epidermidis NOT DETECTED NOT DETECTED   Staphylococcus lugdunensis NOT DETECTED NOT DETECTED   Streptococcus species NOT DETECTED NOT DETECTED   Streptococcus agalactiae NOT DETECTED NOT DETECTED   Streptococcus pneumoniae NOT DETECTED NOT DETECTED   Streptococcus pyogenes NOT DETECTED NOT DETECTED   A.calcoaceticus-baumannii NOT DETECTED NOT DETECTED   Bacteroides fragilis NOT DETECTED NOT DETECTED   Enterobacterales NOT DETECTED NOT DETECTED   Enterobacter cloacae complex NOT DETECTED NOT DETECTED   Escherichia coli NOT DETECTED NOT DETECTED   Klebsiella aerogenes NOT DETECTED NOT DETECTED   Klebsiella oxytoca NOT DETECTED NOT DETECTED   Klebsiella pneumoniae NOT DETECTED NOT DETECTED   Proteus species NOT DETECTED NOT DETECTED   Salmonella species NOT DETECTED NOT DETECTED   Serratia marcescens NOT DETECTED NOT DETECTED   Haemophilus influenzae NOT DETECTED NOT  DETECTED   Neisseria meningitidis NOT DETECTED NOT DETECTED   Pseudomonas aeruginosa NOT DETECTED NOT DETECTED   Stenotrophomonas maltophilia NOT DETECTED NOT DETECTED   Candida albicans NOT DETECTED NOT DETECTED   Candida auris NOT DETECTED NOT DETECTED   Candida glabrata NOT DETECTED NOT DETECTED   Candida krusei NOT DETECTED NOT DETECTED   Candida parapsilosis NOT DETECTED NOT DETECTED   Candida tropicalis NOT DETECTED NOT DETECTED   Cryptococcus neoformans/gattii NOT DETECTED NOT DETECTED   Meth resistant mecA/C and MREJ NOT DETECTED NOT DETECTED    Jaynie Bream, PharmD Pharmacy Resident  03/14/2021 2:12 PM

## 2021-03-14 NOTE — Progress Notes (Signed)
Progress Note    Randy Henry  UPJ:[id number] DOB: 11-Jan-2001  DOA: 03/11/2021 PCP: [name], MD      Brief Narrative:    Medical records reviewed and are as summarized below:  Randy Henry is a 20 y.o. male with medical history significant for COVID-19 infection in January 2022, atopic eczema, ADHD, substance use disorder, asthma, who presented to the hospital because of multiple skin lesions on his upper extremities.      Assessment/Plan:   Principal Problem:   Sepsis (HCC) Active Problems:   Bacteremia due to Staphylococcus aureus   SVT (supraventricular tachycardia) (HCC)   Nutrition Problem: Inadequate oral intake Etiology: acute illness  Signs/Symptoms: per patient/family report   Body mass index is 25.37 kg/m.   Severe sepsis secondary to MSSA bacteremia and ?left upper extremity cellulitis: Lactic acid was 5.3 on admission.  Continue IV cefazolin.  2D echo is pending.  Atopic eczema, multiple scabbed lesions/rash on bilateral upper extremities, face, chest and back:  Monkey pox viral DNA test is pending.  Continue airborne and contact precautions for now.  S/p SVT on 03/12/2021: Continue long-acting Cardizem for rate control  Hypokalemia: Improved.   ADHD: He is on Evekeo at home.  Substance use disorder: Patient said he uses cocaine and CBD Gummies.  He said he last used cocaine about a week prior to admission.  He said he does not use methadone but his cocaine may have been laced with methadone.  He said he does not use any IV drugs.  Counseled to quit using illicit drugs.     Diet Order             Diet regular Room service appropriate? Yes; Fluid consistency: Thin  Diet effective now                      Consultants: Infectious disease  Procedures: None    Medications:    diltiazem  120 mg Oral Daily   enoxaparin (LOVENOX) injection  40 mg Subcutaneous Q24H   feeding supplement  237 mL Oral TID BM   insulin  aspart  0-5 Units Subcutaneous QHS   insulin aspart  0-9 Units Subcutaneous TID WC   sertraline  50 mg Oral Daily   triamcinolone ointment   Topical TID   Continuous Infusions:  sodium chloride Stopped (03/13/21 0345)    ceFAZolin (ANCEF) IV 2 g (03/14/21 1231)     Anti-infectives (From admission, onward)    Start     Dose/Rate Route Frequency Ordered Stop   03/12/21 1200  vancomycin (VANCOREADY) IVPB 1750 mg/350 mL  Status:  Discontinued        1,750 mg 175 mL/hr over 120 Minutes Intravenous Every 12 hours 03/12/21 0742 03/12/21 1006   03/12/21 1100  vancomycin (VANCOREADY) IVPB 1500 mg/300 mL  Status:  Discontinued        1,500 mg 150 mL/hr over 120 Minutes Intravenous Every 12 hours 03/11/21 2150 03/12/21 0742   03/12/21 1100  ceFAZolin (ANCEF) IVPB 2g/100 mL premix        2 g 200 mL/hr over 30 Minutes Intravenous Every 8 hours 03/12/21 1006     03/12/21 0000  ceFEPIme (MAXIPIME) 2 g in sodium chloride 0.9 % 100 mL IVPB  Status:  Discontinued        2 g 200 mL/hr over 30 Minutes Intravenous Every 8 hours 03/11/21 2037 03/12/21 1006   03/11/21 2200  vancomycin (VANCOCIN) IVPB 1000  mg/200 mL premix        1,000 mg 200 mL/hr over 60 Minutes Intravenous  Once 03/11/21 2037 03/12/21 0154   03/11/21 1745  ceFEPIme (MAXIPIME) 2 g in sodium chloride 0.9 % 100 mL IVPB        2 g 200 mL/hr over 30 Minutes Intravenous  Once 03/11/21 1733 03/11/21 1831   03/11/21 1745  metroNIDAZOLE (FLAGYL) IVPB 500 mg        500 mg 100 mL/hr over 60 Minutes Intravenous  Once 03/11/21 1733 03/11/21 1942   03/11/21 1745  vancomycin (VANCOCIN) IVPB 1000 mg/200 mL premix        1,000 mg 200 mL/hr over 60 Minutes Intravenous  Once 03/11/21 1733 03/11/21 1907              Family Communication/Anticipated D/C date and plan/Code Status   DVT prophylaxis: enoxaparin (LOVENOX) injection 40 mg Start: 03/11/21 2000 SCDs Start: 03/11/21 1939     Code Status: Full Code  Family Communication:  None Disposition Plan:    Status is: Inpatient  Remains inpatient appropriate because:IV treatments appropriate due to intensity of illness or inability to take PO  Dispo:  Patient From: Home  Planned Disposition: Home  Medically stable for discharge: No             Subjective:   Interval events noted.  No complaints.  His room was searched yesterday by security on suspicion that he may have been using illicit drugs in the room.   Objective:    Vitals:   03/13/21 1917 03/14/21 0438 03/14/21 0439 03/14/21 0834  BP: 138/82  130/75 132/80  Pulse: 87 83 78 85  Resp:    20  Temp: 98.3 F (36.8 C)  98 F (36.7 C) 97.9 F (36.6 C)  TempSrc:    Oral  SpO2: 94% 100% 99% 98%  Weight:      Height:       No data found.   Intake/Output Summary (Last 24 hours) at 03/14/2021 1407 Last data filed at 03/14/2021 0441 Gross per 24 hour  Intake 100.14 ml  Output 600 ml  Net -499.86 ml   Filed Weights   03/11/21 1804 03/11/21 2317  Weight: 89.8 kg 82.5 kg    Exam:  GEN: NAD SKIN: Rash on the face, chest, back and abdomen which he attributes to eczema.  He also has rash/multiple scabbed lesions on the upper extremities EYES: No pallor or icterus ENT: MMM CV: RRR PULM: CTA B ABD: soft, ND, NT, +BS CNS: AAO x 3, non focal EXT: No edema or tenderness       Data Reviewed:   I have personally reviewed following labs and imaging studies:  Labs: Labs show the following:   Basic Metabolic Panel: Recent Labs  Lab 03/11/21 1545 03/11/21 1746 03/12/21 0436 03/13/21 0444  NA 135  --  136 139  K 3.4*  --  3.7 3.6  CL 99  --  103 102  CO2 21*  --  27 30  GLUCOSE 167*  --  107* 84  BUN 13  --  9 5*  CREATININE 1.06  --  0.94 0.99  CALCIUM 9.6  --  8.7* 8.7*  MG  --  2.2  --  2.2   GFR Estimated Creatinine Clearance: 126.8 mL/min (by C-G formula based on SCr of 0.99 mg/dL). Liver Function Tests: Recent Labs  Lab 03/11/21 1545  AST 40  ALT 52*  ALKPHOS  80  BILITOT 0.9  PROT 7.8  ALBUMIN 4.7   No results for input(s): LIPASE, AMYLASE in the last 168 hours. No results for input(s): AMMONIA in the last 168 hours. Coagulation profile No results for input(s): INR, PROTIME in the last 168 hours.  CBC: Recent Labs  Lab 03/11/21 1545 03/12/21 0436  WBC 19.7* 8.5  NEUTROABS 17.0*  --   HGB 16.6 14.0  HCT 44.8 39.8  MCV 84.7 86.0  PLT 337 242   Cardiac Enzymes: No results for input(s): CKTOTAL, CKMB, CKMBINDEX, TROPONINI in the last 168 hours. BNP (last 3 results) No results for input(s): PROBNP in the last 8760 hours. CBG: Recent Labs  Lab 03/13/21 1209 03/13/21 1725 03/13/21 2211 03/14/21 0831 03/14/21 1154  GLUCAP 110* 155* 132* 88 100*   D-Dimer: No results for input(s): DDIMER in the last 72 hours. Hgb A1c: Recent Labs    03/12/21 0436  HGBA1C 4.9   Lipid Profile: No results for input(s): CHOL, HDL, LDLCALC, TRIG, CHOLHDL, LDLDIRECT in the last 72 hours. Thyroid function studies: Recent Labs    03/11/21 1746  TSH 0.592   Anemia work up: No results for input(s): VITAMINB12, FOLATE, FERRITIN, TIBC, IRON, RETICCTPCT in the last 72 hours. Sepsis Labs: Recent Labs  Lab 03/11/21 1545 03/11/21 1746 03/12/21 0436  WBC 19.7*  --  8.5  LATICACIDVEN 5.3* 3.6* 0.8    Microbiology Recent Results (from the past 240 hour(s))  Blood culture (routine x 2)     Status: Abnormal   Collection Time: 03/11/21  5:46 PM   Specimen: BLOOD  Result Value Ref Range Status   Specimen Description   Final    BLOOD LEFT ANTECUBITAL Performed at [facility], [address], [place], [place] [address]    Special Requests   Final    BOTTLES DRAWN AEROBIC AND ANAEROBIC Blood Culture adequate volume Performed at [facility], [address] [place], [place] [address]    Culture  Setup Time   Final    GRAM POSITIVE COCCI IN BOTH AEROBIC AND ANAEROBIC BOTTLES Organism ID to follow CRITICAL RESULT CALLED  TO, READ BACK BY AND VERIFIED WITH: Algie Coffer, PHARMD AT 8546 ON 03/12/21 BY GM Performed at [facility], [address] [place], [place] [address]    Culture STAPHYLOCOCCUS AUREUS (A)  Final   Report Status 03/14/2021 FINAL  Final   Organism ID, Bacteria STAPHYLOCOCCUS AUREUS  Final      Susceptibility   Staphylococcus aureus - MIC*    CIPROFLOXACIN <=0.5 SENSITIVE Sensitive     ERYTHROMYCIN <=0.25 SENSITIVE Sensitive     GENTAMICIN <=0.5 SENSITIVE Sensitive     OXACILLIN 0.5 SENSITIVE Sensitive     TETRACYCLINE <=1 SENSITIVE Sensitive     VANCOMYCIN 1 SENSITIVE Sensitive     TRIMETH/SULFA <=10 SENSITIVE Sensitive     CLINDAMYCIN <=0.25 SENSITIVE Sensitive     RIFAMPIN <=0.5 SENSITIVE Sensitive     Inducible Clindamycin NEGATIVE Sensitive     * STAPHYLOCOCCUS AUREUS  Blood culture (routine x 2)     Status: None (Preliminary result)   Collection Time: 03/11/21  5:46 PM   Specimen: BLOOD  Result Value Ref Range Status   Specimen Description BLOOD RIGHT ANTECUBITAL  Final   Special Requests   Final    BOTTLES DRAWN AEROBIC AND ANAEROBIC Blood Culture adequate volume   Culture  Setup Time   Final    GRAM POSITIVE COCCI ANAEROBIC BOTTLE ONLY CRITICAL RESULT CALLED TO, READ BACK BY AND VERIFIED WITH: CAROLINE  CHILDS 03/14/21 1343 AMK Performed at [facility], [address] [place], [place] [address]    Culture [facility] POSITIVE COCCI  Final   Report Status PENDING  Incomplete  Aerobic Culture w Gram Stain (superficial specimen)     Status: None   Collection Time: 03/11/21  5:46 PM   Specimen: Arm  Result Value Ref Range Status   Specimen Description   Final    ARM Performed at [facility], [address] [place], [place] [address]    Special Requests   Final    NONE Performed at [facility], [address] [place], [place] [address]    Gram Stain NO WBC SEEN NO ORGANISMS SEEN   Final   Culture   Final    ABUNDANT  STAPHYLOCOCCUS AUREUS ABUNDANT STREPTOCOCCUS GROUP C Beta hemolytic streptococci are predictably susceptible to penicillin and other beta lactams. Susceptibility testing not routinely performed. Performed at [facility], [address] [place], [place] [address]    Report Status 03/14/2021 FINAL  Final   Organism ID, Bacteria STAPHYLOCOCCUS AUREUS  Final      Susceptibility   Staphylococcus aureus - MIC*    CIPROFLOXACIN <=0.5 SENSITIVE Sensitive     ERYTHROMYCIN <=0.25 SENSITIVE Sensitive     GENTAMICIN <=0.5 SENSITIVE Sensitive     OXACILLIN 0.5 SENSITIVE Sensitive     TETRACYCLINE <=1 SENSITIVE Sensitive     VANCOMYCIN <=0.5 SENSITIVE Sensitive     TRIMETH/SULFA <=10 SENSITIVE Sensitive     CLINDAMYCIN <=0.25 SENSITIVE Sensitive     RIFAMPIN <=0.5 SENSITIVE Sensitive     Inducible Clindamycin NEGATIVE Sensitive     * ABUNDANT STAPHYLOCOCCUS AUREUS  Blood Culture ID Panel (Reflexed)     Status: Abnormal   Collection Time: 03/11/21  5:46 PM  Result Value Ref Range Status   Enterococcus faecalis NOT DETECTED NOT DETECTED Final   Enterococcus Faecium NOT DETECTED NOT DETECTED Final   Listeria monocytogenes NOT DETECTED NOT DETECTED Final   Staphylococcus species DETECTED (A) NOT DETECTED Final    Comment: CRITICAL RESULT CALLED TO, READ BACK BY AND VERIFIED WITH: Algie Coffer, PHARMD AT 0929 ON 03/12/21 BY GM    Staphylococcus aureus (BCID) DETECTED (A) NOT DETECTED Final    Comment: CRITICAL RESULT CALLED TO, READ BACK BY AND VERIFIED WITH: Algie Coffer, PHARMD AT 0929 ON 03/12/21 BY GM    Staphylococcus epidermidis NOT DETECTED NOT DETECTED Final   Staphylococcus lugdunensis NOT DETECTED NOT DETECTED Final   Streptococcus species NOT DETECTED NOT DETECTED Final   Streptococcus agalactiae NOT DETECTED NOT DETECTED Final   Streptococcus pneumoniae NOT DETECTED NOT DETECTED Final   Streptococcus pyogenes NOT DETECTED NOT DETECTED Final   A.calcoaceticus-baumannii NOT  DETECTED NOT DETECTED Final   Bacteroides fragilis NOT DETECTED NOT DETECTED Final   Enterobacterales NOT DETECTED NOT DETECTED Final   Enterobacter cloacae complex NOT DETECTED NOT DETECTED Final   Escherichia coli NOT DETECTED NOT DETECTED Final   Klebsiella aerogenes NOT DETECTED NOT DETECTED Final   Klebsiella oxytoca NOT DETECTED NOT DETECTED Final   Klebsiella pneumoniae NOT DETECTED NOT DETECTED Final   Proteus species NOT DETECTED NOT DETECTED Final   Salmonella species NOT DETECTED NOT DETECTED Final   Serratia marcescens NOT DETECTED NOT DETECTED Final   Haemophilus influenzae NOT DETECTED NOT DETECTED Final   Neisseria meningitidis NOT DETECTED NOT DETECTED Final   Pseudomonas aeruginosa NOT DETECTED NOT DETECTED Final   Stenotrophomonas maltophilia NOT DETECTED NOT DETECTED Final  Candida albicans NOT DETECTED NOT DETECTED Final   Candida auris NOT DETECTED NOT DETECTED Final   Candida glabrata NOT DETECTED NOT DETECTED Final   Candida krusei NOT DETECTED NOT DETECTED Final   Candida parapsilosis NOT DETECTED NOT DETECTED Final   Candida tropicalis NOT DETECTED NOT DETECTED Final   Cryptococcus neoformans/gattii NOT DETECTED NOT DETECTED Final   Meth resistant mecA/C and MREJ NOT DETECTED NOT DETECTED Final    Comment: Performed at [facility], [address] [place], [place] [address]  MRSA Next Gen by PCR, Nasal     Status: None   Collection Time: 03/12/21  1:03 AM   Specimen: Nasal Mucosa; Nasal Swab  Result Value Ref Range Status   MRSA by PCR Next Gen NOT DETECTED NOT DETECTED Final    Comment: (NOTE) The GeneXpert MRSA Assay (FDA approved for NASAL specimens only), is one component of a comprehensive MRSA colonization surveillance program. It is not intended to diagnose MRSA infection nor to guide or monitor treatment for MRSA infections. Test performance is not FDA approved in patients less than 8 years old. Performed at [facility], [address] [place], [place] [address]   Chlamydia/NGC rt PCR [facility] only)     Status: None   Collection Time: 03/12/21  1:55 AM   Specimen: Urine  Result Value Ref Range Status   Specimen source GC/Chlam URINE, RANDOM  Final   Chlamydia Tr NOT DETECTED NOT DETECTED Final   N gonorrhoeae NOT DETECTED NOT DETECTED Final    Comment: (NOTE) This CT/NG assay has not been evaluated in patients with a history of  hysterectomy. Performed at [facility], [address] [place], [place] [address]   CULTURE, BLOOD (ROUTINE X 2) w Reflex to ID Panel     Status: None (Preliminary result)   Collection Time: 03/13/21 12:11 AM   Specimen: BLOOD  Result Value Ref Range Status   Specimen Description BLOOD BLOOD LEFT FOREARM  Final   Special Requests   Final    BOTTLES DRAWN AEROBIC AND ANAEROBIC Blood Culture adequate volume   Culture   Final    NO GROWTH 1 DAY Performed at [facility], [address], [place], [place] [address]    Report Status PENDING  Incomplete  CULTURE, BLOOD (ROUTINE X 2) w Reflex to ID Panel     Status: None (Preliminary result)   Collection Time: 03/13/21 12:11 AM   Specimen: BLOOD  Result Value Ref Range Status   Specimen Description BLOOD BLOOD LEFT HAND  Final   Special Requests   Final    BOTTLES DRAWN AEROBIC AND ANAEROBIC Blood Culture adequate volume   Culture   Final    NO GROWTH 1 DAY Performed at [facility], [address] [place], [place] [address]    Report Status PENDING  Incomplete    Procedures and diagnostic studies:  DG Chest 2 View  Result Date: 03/12/2021 CLINICAL DATA:  Sepsis COVID EXAM: CHEST - 2 VIEW COMPARISON:  09/22/2010 FINDINGS: The heart size and mediastinal contours are within normal limits. Both lungs are clear. The visualized skeletal structures are unremarkable. IMPRESSION: No active cardiopulmonary disease. Electronically Signed   By: Jasmine Pang M.D.   On: 03/12/2021 18:14                LOS: 3 days   Elveria Lauderbaugh  [facility]   Pager on [email or web address]. If 7PM-7AM, please contact night-coverage at [email or web address]  03/14/2021, 2:07 PM

## 2021-03-14 NOTE — Progress Notes (Signed)
*  PRELIMINARY RESULTS* Echocardiogram 2D Echocardiogram has been performed.  Randy Henry 03/14/2021, 5:30 PM

## 2021-03-15 DIAGNOSIS — R7881 Bacteremia: Secondary | ICD-10-CM | POA: Diagnosis not present

## 2021-03-15 DIAGNOSIS — A4101 Sepsis due to Methicillin susceptible Staphylococcus aureus: Secondary | ICD-10-CM | POA: Diagnosis not present

## 2021-03-15 DIAGNOSIS — B9561 Methicillin susceptible Staphylococcus aureus infection as the cause of diseases classified elsewhere: Secondary | ICD-10-CM | POA: Diagnosis not present

## 2021-03-15 DIAGNOSIS — I471 Supraventricular tachycardia: Secondary | ICD-10-CM | POA: Diagnosis not present

## 2021-03-15 LAB — MONKEYPOX VIRUS DNA, QUALITATIVE REAL-TIME PCR: Orthopoxvirus DNA, QL PCR: NOT DETECTED

## 2021-03-15 LAB — GLUCOSE, CAPILLARY
Glucose-Capillary: 108 mg/dL — ABNORMAL HIGH (ref 70–99)
Glucose-Capillary: 132 mg/dL — ABNORMAL HIGH (ref 70–99)
Glucose-Capillary: 119 mg/dL — ABNORMAL HIGH (ref 70–99)
Glucose-Capillary: 107 mg/dL — ABNORMAL HIGH (ref 70–99)

## 2021-03-15 LAB — PROTIME-INR
INR: 0.9 (ref 0.8–1.2)
Prothrombin Time: 12.6 seconds (ref 11.4–15.2)

## 2021-03-15 MED ORDER — VALACYCLOVIR HCL 500 MG PO TABS
500.0000 mg | ORAL_TABLET | Freq: Two times a day (BID) | ORAL | Status: DC
  Administered 2021-03-15 – 2021-03-25 (×19): 500 mg via ORAL
  Filled 2021-03-15 (×22): qty 1

## 2021-03-15 NOTE — Progress Notes (Signed)
Progress Note    YAMAN GRAUBERGER  HEN:[id number] DOB: 2001-03-29  DOA: 03/11/2021 PCP: [name], MD      Brief Narrative:    Medical records reviewed and are as summarized below:  Harriet Masson is a 20 y.o. male with medical history significant for COVID-19 infection in January 2022, atopic eczema, ADHD, substance use disorder, asthma, who presented to the hospital because of multiple skin lesions on his upper extremities.      Assessment/Plan:   Principal Problem:   Sepsis (HCC) Active Problems:   Bacteremia due to Staphylococcus aureus   SVT (supraventricular tachycardia) (HCC)   Nutrition Problem: Inadequate oral intake Etiology: acute illness  Signs/Symptoms: per patient/family report   Body mass index is 25.37 kg/m.   Severe sepsis secondary to MSSA bacteremia and ?left upper extremity cellulitis: Lactic acid was 5.3 on admission.  Continue IV cefazolin.  2D echo shows normal LV function and there was no evidence of vegetation or infective endocarditis on 2D echo.  Dr. Joylene Draft, ID specialist, recommends TEE.  Dr. Kirke Corin, cardiologist has been consulted for this.  Atopic eczema, multiple scabbed lesions/rash on bilateral upper extremities, face, chest and back:  Monkey pox viral DNA test is pending.  Continue airborne and contact precautions for now.  S/p SVT on 03/12/2021: Continue long-acting Cardizem for rate control  Hypokalemia: Improved.   ADHD: His mother said he does not take Evekeo at home and that he is medically nonadherent.  Substance use disorder: Patient said he uses cocaine and CBD Gummies.  He said he last used cocaine about a week prior to admission.  He said he does not use methadone but his cocaine may have been laced with methadone.  He said he does not use any IV drugs.   Mother said patient had "snorted" prednisone and amphetamines on 03/10/2021.  Reportedly, the prednisone was prescribed for the new lesions that he had developed  on his upper extremities.  Counseled to quit using illicit drugs.      Diet Order             Diet regular Room service appropriate? Yes; Fluid consistency: Thin  Diet effective now                      Consultants: Infectious disease Cardiologist  Procedures: None    Medications:    diltiazem  120 mg Oral Daily   enoxaparin (LOVENOX) injection  40 mg Subcutaneous Q24H   feeding supplement  237 mL Oral TID BM   insulin aspart  0-5 Units Subcutaneous QHS   insulin aspart  0-9 Units Subcutaneous TID WC   sertraline  50 mg Oral Daily   triamcinolone ointment   Topical TID   valACYclovir  500 mg Oral BID   Continuous Infusions:  sodium chloride 250 mL (03/15/21 0052)    ceFAZolin (ANCEF) IV 2 g (03/15/21 1035)     Anti-infectives (From admission, onward)    Start     Dose/Rate Route Frequency Ordered Stop   03/15/21 1045  valACYclovir (VALTREX) tablet 500 mg        500 mg Oral 2 times daily 03/15/21 0945     03/12/21 1200  vancomycin (VANCOREADY) IVPB 1750 mg/350 mL  Status:  Discontinued        1,750 mg 175 mL/hr over 120 Minutes Intravenous Every 12 hours 03/12/21 0742 03/12/21 1006   03/12/21 1100  vancomycin (VANCOREADY) IVPB 1500 mg/300 mL  Status:  Discontinued        1,500 mg 150 mL/hr over 120 Minutes Intravenous Every 12 hours 03/11/21 2150 03/12/21 0742   03/12/21 1100  ceFAZolin (ANCEF) IVPB 2g/100 mL premix        2 g 200 mL/hr over 30 Minutes Intravenous Every 8 hours 03/12/21 1006     03/12/21 0000  ceFEPIme (MAXIPIME) 2 g in sodium chloride 0.9 % 100 mL IVPB  Status:  Discontinued        2 g 200 mL/hr over 30 Minutes Intravenous Every 8 hours 03/11/21 2037 03/12/21 1006   03/11/21 2200  vancomycin (VANCOCIN) IVPB 1000 mg/200 mL premix        1,000 mg 200 mL/hr over 60 Minutes Intravenous  Once 03/11/21 2037 03/12/21 0154   03/11/21 1745  ceFEPIme (MAXIPIME) 2 g in sodium chloride 0.9 % 100 mL IVPB        2 g 200 mL/hr over 30 Minutes  Intravenous  Once 03/11/21 1733 03/11/21 1831   03/11/21 1745  metroNIDAZOLE (FLAGYL) IVPB 500 mg        500 mg 100 mL/hr over 60 Minutes Intravenous  Once 03/11/21 1733 03/11/21 1942   03/11/21 1745  vancomycin (VANCOCIN) IVPB 1000 mg/200 mL premix        1,000 mg 200 mL/hr over 60 Minutes Intravenous  Once 03/11/21 1733 03/11/21 1907              Family Communication/Anticipated D/C date and plan/Code Status   DVT prophylaxis: enoxaparin (LOVENOX) injection 40 mg Start: 03/11/21 2000 SCDs Start: 03/11/21 1939     Code Status: Full Code  Family Communication: Heidi, mother, at the bedside Disposition Plan:    Status is: Inpatient  Remains inpatient appropriate because:IV treatments appropriate due to intensity of illness or inability to take PO  Dispo:  Patient From: Home  Planned Disposition: Home  Medically stable for discharge: No             Subjective:   Interval events noted.  He has no complaints.  His mother was at the bedside.  Objective:    Vitals:   03/14/21 1751 03/14/21 1954 03/15/21 0453 03/15/21 0811  BP: 137/74 137/77 137/76 (!) 129/91  Pulse: 71 (!) 103 89 86  Resp: Temp: 97.9 F (36.6 C) 97.8 F (36.6 C) 98.8 F (37.1 C) 98.6 F (37 C)  TempSrc: Oral Oral Oral   SpO2: 98% 98% 99% 97%  Weight:      Height:       No data found.  No intake or output data in the 24 hours ending 03/15/21 1328  Filed Weights   03/11/21 1804 03/11/21 2317  Weight: 89.8 kg 82.5 kg    Exam:  GEN: NAD SKIN: Rash on the face, chest, back and abdomen.  He also has rash/multiple scabbed lesions on the upper extremities EYES: EOMI ENT: MMM CV: RRR PULM: CTA B ABD: soft, ND, NT, +BS CNS: AAO x 3, non focal EXT: No edema or tenderness        Data Reviewed:   I have personally reviewed following labs and imaging studies:  Labs: Labs show the following:   Basic Metabolic Panel: Recent Labs  Lab 03/11/21 1545  03/11/21 1746 03/12/21 0436 03/13/21 0444  NA 135  --  136 139  K 3.4*  --  3.7 3.6  CL 99  --  103 102  CO2 21*  --  27 30  GLUCOSE 167*  --  107* 84  BUN 13  --  9 5*  CREATININE 1.06  --  0.94 0.99  CALCIUM 9.6  --  8.7* 8.7*  MG  --  2.2  --  2.2   GFR Estimated Creatinine Clearance: 126.8 mL/min (by C-G formula based on SCr of 0.99 mg/dL). Liver Function Tests: Recent Labs  Lab 03/11/21 1545  AST 40  ALT 52*  ALKPHOS 80  BILITOT 0.9  PROT 7.8  ALBUMIN 4.7   No results for input(s): LIPASE, AMYLASE in the last 168 hours. No results for input(s): AMMONIA in the last 168 hours. Coagulation profile No results for input(s): INR, PROTIME in the last 168 hours.  CBC: Recent Labs  Lab 03/11/21 1545 03/12/21 0436  WBC 19.7* 8.5  NEUTROABS 17.0*  --   HGB 16.6 14.0  HCT 44.8 39.8  MCV 84.7 86.0  PLT 337 242   Cardiac Enzymes: No results for input(s): CKTOTAL, CKMB, CKMBINDEX, TROPONINI in the last 168 hours. BNP (last 3 results) No results for input(s): PROBNP in the last 8760 hours. CBG: Recent Labs  Lab 03/14/21 0831 03/14/21 1154 03/14/21 2116 03/15/21 0813 03/15/21 1145  GLUCAP 88 100* 131* 108* 132*   D-Dimer: No results for input(s): DDIMER in the last 72 hours. Hgb A1c: No results for input(s): HGBA1C in the last 72 hours.  Lipid Profile: No results for input(s): CHOL, HDL, LDLCALC, TRIG, CHOLHDL, LDLDIRECT in the last 72 hours. Thyroid function studies: No results for input(s): TSH, T4TOTAL, T3FREE, THYROIDAB in the last 72 hours.  Invalid input(s): FREET3  Anemia work up: No results for input(s): VITAMINB12, FOLATE, FERRITIN, TIBC, IRON, RETICCTPCT in the last 72 hours. Sepsis Labs: Recent Labs  Lab 03/11/21 1545 03/11/21 1746 03/12/21 0436  WBC 19.7*  --  8.5  LATICACIDVEN 5.3* 3.6* 0.8    Microbiology Recent Results (from the past 240 hour(s))  Blood culture (routine x 2)     Status: Abnormal   Collection Time: 03/11/21  5:46  PM   Specimen: BLOOD  Result Value Ref Range Status   Specimen Description   Final    BLOOD LEFT ANTECUBITAL Performed at [facility], [address], [place], [place] [address]    Special Requests   Final    BOTTLES DRAWN AEROBIC AND ANAEROBIC Blood Culture adequate volume Performed at [facility], [address] [place], [place] [address]    Culture  Setup Time   Final    GRAM POSITIVE COCCI IN BOTH AEROBIC AND ANAEROBIC BOTTLES Organism ID to follow CRITICAL RESULT CALLED TO, READ BACK BY AND VERIFIED WITH: Algie Coffer, PHARMD AT 0929 ON 03/12/21 BY GM Performed at [facility], [address] [place], [place] [address]    Culture STAPHYLOCOCCUS AUREUS (A)  Final   Report Status 03/14/2021 FINAL  Final   Organism ID, Bacteria STAPHYLOCOCCUS AUREUS  Final      Susceptibility   Staphylococcus aureus - MIC*    CIPROFLOXACIN <=0.5 SENSITIVE Sensitive     ERYTHROMYCIN <=0.25 SENSITIVE Sensitive     GENTAMICIN <=0.5 SENSITIVE Sensitive     OXACILLIN 0.5 SENSITIVE Sensitive     TETRACYCLINE <=1 SENSITIVE Sensitive     VANCOMYCIN 1 SENSITIVE Sensitive     TRIMETH/SULFA <=10 SENSITIVE Sensitive     CLINDAMYCIN <=0.25 SENSITIVE Sensitive     RIFAMPIN <=0.5 SENSITIVE Sensitive     Inducible Clindamycin NEGATIVE Sensitive     * STAPHYLOCOCCUS AUREUS  Blood culture (routine x 2)  Status: Abnormal (Preliminary result)   Collection Time: 03/11/21  5:46 PM   Specimen: BLOOD  Result Value Ref Range Status   Specimen Description   Final    BLOOD RIGHT ANTECUBITAL Performed at [facility], [address] [place], [place] [address]    Special Requests   Final    BOTTLES DRAWN AEROBIC AND ANAEROBIC Blood Culture adequate volume Performed at [facility], [address] [place], [place] [address]    Culture  Setup Time   Final    GRAM POSITIVE COCCI ANAEROBIC BOTTLE ONLY CRITICAL RESULT CALLED TO, READ BACK BY AND VERIFIED  WITH: CAROLINE CHILDS 03/14/21 1343 AMK Performed at [facility], [address] [place], [place] [address]    Culture (A)  Final    STAPHYLOCOCCUS AUREUS SUSCEPTIBILITIES TO FOLLOW Performed at [facility], [address] [place], [place] [address]    Report Status PENDING  Incomplete  Aerobic Culture w Gram Stain (superficial specimen)     Status: None   Collection Time: 03/11/21  5:46 PM   Specimen: Arm  Result Value Ref Range Status   Specimen Description   Final    ARM Performed at [facility], [address] [place], [place] [address]    Special Requests   Final    NONE Performed at [facility], [address] [place], [place] [address]    Gram Stain NO WBC SEEN NO ORGANISMS SEEN   Final   Culture   Final    ABUNDANT STAPHYLOCOCCUS AUREUS ABUNDANT STREPTOCOCCUS GROUP C Beta hemolytic streptococci are predictably susceptible to penicillin and other beta lactams. Susceptibility testing not routinely performed. Performed at [facility], [address], [place], [place] [address]    Report Status 03/14/2021 FINAL  Final   Organism ID, Bacteria STAPHYLOCOCCUS AUREUS  Final      Susceptibility   Staphylococcus aureus - MIC*    CIPROFLOXACIN <=0.5 SENSITIVE Sensitive     ERYTHROMYCIN <=0.25 SENSITIVE Sensitive     GENTAMICIN <=0.5 SENSITIVE Sensitive     OXACILLIN 0.5 SENSITIVE Sensitive     TETRACYCLINE <=1 SENSITIVE Sensitive     VANCOMYCIN <=0.5 SENSITIVE Sensitive     TRIMETH/SULFA <=10 SENSITIVE Sensitive     CLINDAMYCIN <=0.25 SENSITIVE Sensitive     RIFAMPIN <=0.5 SENSITIVE Sensitive     Inducible Clindamycin NEGATIVE Sensitive     * ABUNDANT STAPHYLOCOCCUS AUREUS  Blood Culture ID Panel (Reflexed)     Status: Abnormal   Collection Time: 03/11/21  5:46 PM  Result Value Ref Range Status   Enterococcus faecalis NOT DETECTED NOT DETECTED Final   Enterococcus Faecium NOT DETECTED NOT DETECTED Final   Listeria  monocytogenes NOT DETECTED NOT DETECTED Final   Staphylococcus species DETECTED (A) NOT DETECTED Final    Comment: CRITICAL RESULT CALLED TO, READ BACK BY AND VERIFIED WITH: Algie Coffer, PHARMD AT 0929 ON 03/12/21 BY GM    Staphylococcus aureus (BCID) DETECTED (A) NOT DETECTED Final    Comment: CRITICAL RESULT CALLED TO, READ BACK BY AND VERIFIED WITH: Algie Coffer, PHARMD AT 0929 ON 03/12/21 BY GM    Staphylococcus epidermidis NOT DETECTED NOT DETECTED Final   Staphylococcus lugdunensis NOT DETECTED NOT DETECTED Final   Streptococcus species NOT DETECTED NOT DETECTED Final   Streptococcus agalactiae NOT DETECTED NOT DETECTED Final   Streptococcus pneumoniae NOT DETECTED NOT DETECTED Final   Streptococcus pyogenes NOT DETECTED NOT DETECTED Final   A.calcoaceticus-baumannii NOT DETECTED NOT DETECTED  Final   Bacteroides fragilis NOT DETECTED NOT DETECTED Final   Enterobacterales NOT DETECTED NOT DETECTED Final   Enterobacter cloacae complex NOT DETECTED NOT DETECTED Final   Escherichia coli NOT DETECTED NOT DETECTED Final   Klebsiella aerogenes NOT DETECTED NOT DETECTED Final   Klebsiella oxytoca NOT DETECTED NOT DETECTED Final   Klebsiella pneumoniae NOT DETECTED NOT DETECTED Final   Proteus species NOT DETECTED NOT DETECTED Final   Salmonella species NOT DETECTED NOT DETECTED Final   Serratia marcescens NOT DETECTED NOT DETECTED Final   Haemophilus influenzae NOT DETECTED NOT DETECTED Final   Neisseria meningitidis NOT DETECTED NOT DETECTED Final   Pseudomonas aeruginosa NOT DETECTED NOT DETECTED Final   Stenotrophomonas maltophilia NOT DETECTED NOT DETECTED Final   Candida albicans NOT DETECTED NOT DETECTED Final   Candida auris NOT DETECTED NOT DETECTED Final   Candida glabrata NOT DETECTED NOT DETECTED Final   Candida krusei NOT DETECTED NOT DETECTED Final   Candida parapsilosis NOT DETECTED NOT DETECTED Final   Candida tropicalis NOT DETECTED NOT DETECTED Final   Cryptococcus  neoformans/gattii NOT DETECTED NOT DETECTED Final   Meth resistant mecA/C and MREJ NOT DETECTED NOT DETECTED Final    Comment: Performed at [facility], [address] [place], [place] [address]  MRSA Next Gen by PCR, Nasal     Status: None   Collection Time: 03/12/21  1:03 AM   Specimen: Nasal Mucosa; Nasal Swab  Result Value Ref Range Status   MRSA by PCR Next Gen NOT DETECTED NOT DETECTED Final    Comment: (NOTE) The GeneXpert MRSA Assay (FDA approved for NASAL specimens only), is one component of a comprehensive MRSA colonization surveillance program. It is not intended to diagnose MRSA infection nor to guide or monitor treatment for MRSA infections. Test performance is not FDA approved in patients less than 41 years old. Performed at [facility], [address] [place], [place] [address]   Chlamydia/NGC rt PCR [facility] only)     Status: None   Collection Time: 03/12/21  1:55 AM   Specimen: Urine  Result Value Ref Range Status   Specimen source GC/Chlam URINE, RANDOM  Final   Chlamydia Tr NOT DETECTED NOT DETECTED Final   N gonorrhoeae NOT DETECTED NOT DETECTED Final    Comment: (NOTE) This CT/NG assay has not been evaluated in patients with a history of  hysterectomy. Performed at [facility], [address] [place], [place] [address]   CULTURE, BLOOD (ROUTINE X 2) w Reflex to ID Panel     Status: None (Preliminary result)   Collection Time: 03/13/21 12:11 AM   Specimen: BLOOD  Result Value Ref Range Status   Specimen Description BLOOD BLOOD LEFT FOREARM  Final   Special Requests   Final    BOTTLES DRAWN AEROBIC AND ANAEROBIC Blood Culture adequate volume   Culture   Final    NO GROWTH 2 DAYS Performed at [facility], [address] [place], [place] [address]    Report Status PENDING  Incomplete  CULTURE, BLOOD (ROUTINE X 2) w Reflex to ID Panel     Status: None (Preliminary result)   Collection Time: 03/13/21 12:11 AM    Specimen: BLOOD  Result Value Ref Range Status   Specimen Description BLOOD BLOOD LEFT HAND  Final   Special Requests   Final    BOTTLES DRAWN AEROBIC AND ANAEROBIC Blood Culture adequate volume   Culture   Final    NO GROWTH 2 DAYS  Performed at [facility], [address] [place], [place] [address]    Report Status PENDING  Incomplete    Procedures and diagnostic studies:  ECHOCARDIOGRAM COMPLETE  Result Date: 03/14/2021    ECHOCARDIOGRAM REPORT   Patient Name:   ABRON NEDDO Date of Exam: 03/14/2021 Medical Rec #:  [id number]     Height:       71.0 in Accession #:    [id number]    Weight:       181.9 lb Date of Birth:  29-Jul-2000     BSA:          2.025 m Patient Age:    20 years      BP:           140/73 mmHg Patient Gender: M             HR:           73 bpm. Exam Location:  [facility] Procedure: 2D Echo, Cardiac Doppler, Color Doppler and Strain Analysis Indications:     Bacteremia R78.81  History:         Patient has no prior history of Echocardiogram examinations.  Sonographer:     Neysa Bonito Roar Referring Phys:  [id number] Lurene Shadow Diagnosing Phys: Julien Nordmann MD IMPRESSIONS  1. No valve endocarditis noted  2. Left ventricular ejection fraction, by estimation, is 60 to 65%. The left ventricle has normal function. The left ventricle has no regional wall motion abnormalities. Left ventricular diastolic parameters were normal. The average left ventricular global longitudinal strain is -19.5 %. The global longitudinal strain is normal.  3. Right ventricular systolic function is normal. The right ventricular size is normal.  4. The mitral valve is normal in structure. No evidence of mitral valve regurgitation. No evidence of mitral stenosis.  5. The aortic valve is normal in structure. Aortic valve regurgitation is not visualized. No aortic stenosis is present.  6. The inferior vena cava is normal in size with greater than 50% respiratory variability, suggesting right atrial pressure of 3  mmHg. FINDINGS  Left Ventricle: Left ventricular ejection fraction, by estimation, is 60 to 65%. The left ventricle has normal function. The left ventricle has no regional wall motion abnormalities. The average left ventricular global longitudinal strain is -19.5 %. The global longitudinal strain is normal. The left ventricular internal cavity size was normal in size. There is no left ventricular hypertrophy. Left ventricular diastolic parameters were normal. Right Ventricle: The right ventricular size is normal. No increase in right ventricular wall thickness. Right ventricular systolic function is normal. Left Atrium: Left atrial size was normal in size. Right Atrium: Right atrial size was normal in size. Pericardium: There is no evidence of pericardial effusion. Mitral Valve: The mitral valve is normal in structure. No evidence of mitral valve regurgitation. No evidence of mitral valve stenosis. Tricuspid Valve: The tricuspid valve is normal in structure. Tricuspid valve regurgitation is not demonstrated. No evidence of tricuspid stenosis. Aortic Valve: The aortic valve is normal in structure. Aortic valve regurgitation is not visualized. No aortic stenosis is present. Aortic valve peak gradient measures 6.4 mmHg. Pulmonic Valve: The pulmonic valve was normal in structure. Pulmonic valve regurgitation is not visualized. No evidence of pulmonic stenosis. Aorta: The aortic root is normal in size and structure. Venous: The inferior vena cava is normal in size with greater than 50% respiratory variability, suggesting right atrial pressure of 3 mmHg. IAS/Shunts: No atrial level shunt detected by color flow Doppler.  LEFT  VENTRICLE PLAX 2D LVIDd:         4.60 cm  Diastology LVIDs:         3.40 cm  LV e' medial:    10.80 cm/s LV PW:         1.00 cm  LV E/e' medial:  7.0 LV IVS:        1.00 cm  LV e' lateral:   11.50 cm/s LVOT diam:     2.50 cm  LV E/e' lateral: 6.6 LVOT Area:     4.91 cm                         2D  Longitudinal Strain                         2D Strain GLS Avg:     -19.5 % RIGHT VENTRICLE RV Basal diam:  3.30 cm RV Mid diam:    2.80 cm RV S prime:     14.00 cm/s TAPSE (M-mode): 2.3 cm LEFT ATRIUM             Index       RIGHT ATRIUM           Index LA diam:        3.30 cm 1.63 cm/m  RA Area:     13.50 cm LA Vol (A2C):   41.5 ml 20.49 ml/m RA Volume:   32.00 ml  15.80 ml/m LA Vol (A4C):   30.8 ml 15.21 ml/m LA Biplane Vol: 38.8 ml 19.16 ml/m  AORTIC VALVE                PULMONIC VALVE AV Area (Vmax): 4.44 cm    PV Vmax:        0.87 m/s AV Vmax:        126.00 cm/s PV Peak grad:   3.0 mmHg AV Peak Grad:   6.4 mmHg    RVOT Peak grad: 3 mmHg LVOT Vmax:      114.00 cm/s  AORTA Ao Root diam: 2.80 cm MITRAL VALVE MV Area (PHT): 3.30 cm    SHUNTS MV Decel Time: 230 msec    Systemic Diam: 2.50 cm MV E velocity: 75.40 cm/s MV A velocity: 38.10 cm/s MV E/A ratio:  1.98 MV A Prime:    8.3 cm/s Julien Nordmann MD Electronically signed by Julien Nordmann MD Signature Date/Time: 03/14/2021/7:07:33 PM    Final                LOS: 4 days   Virna Livengood  [facility]   Pager on [email or web address]. If 7PM-7AM, please contact night-coverage at [email or web address]     03/15/2021, 1:28 PM

## 2021-03-15 NOTE — Progress Notes (Signed)
Date of Admission:  03/11/2021    ID: Randy Henry is a 20 y.o. male  Principal Problem:   Sepsis (HCC) Active Problems:   Bacteremia due to Staphylococcus aureus   SVT (supraventricular tachycardia) (HCC)    Subjective: Feeling better Skin less itchy Less erythematous  Medications:   diltiazem  120 mg Oral Daily   enoxaparin (LOVENOX) injection  40 mg Subcutaneous Q24H   feeding supplement  237 mL Oral TID BM   insulin aspart  0-5 Units Subcutaneous QHS   insulin aspart  0-9 Units Subcutaneous TID WC   sertraline  50 mg Oral Daily   triamcinolone ointment   Topical TID   valACYclovir  500 mg Oral BID    Objective: Vital signs in last 24 hours: Temp:  [97.8 F (36.6 C)-98.8 F (37.1 C)] 98.6 F (37 C) (09/19 0811) Pulse Rate:  [71-103] 86 (09/19 0811) Resp:  [16-20] 18 (09/19 0811) BP: (129-137)/(74-91) 129/91 (09/19 0811) SpO2:  [97 %-99 %] 97 % (09/19 0811)  PHYSICAL EXAM:  General: Alert, cooperative, no distress, appears stated age.  Head: Normocephalic, without obvious abnormality, atraumatic. Eyes: Conjunctivae clear, anicteric sclerae. Pupils are equal ENT Nares normal. No drainage or sinus tenderness. Lips, mucosa, and tongue normal. No Thrush Neck: Supple, symmetrical, no adenopathy, thyroid: non tender no carotid bruit and no JVD. Back: No CVA tenderness. Lungs: Clear to auscultation bilaterally. No Wheezing or Rhonchi. No rales. Heart: Regular rate and rhythm, no murmur, rub or gallop. Abdomen: Soft, non-tender,not distended. Bowel sounds normal. No masses Extremities: atraumatic, no cyanosis. No edema. No clubbing Skin: eczematous rash face and chest- with excoriations and wounds over arms Lymph: Cervical, supraclavicular normal. Neurologic: Grossly non-focal  Lab Results Recent Labs    03/13/21 0444  NA 139  K 3.6  CL 102  CO2 30  BUN 5*  CREATININE 0.99    Microbiology: [facility] 03/11/21 MSSA 03/13/21 BC NG WC MSSA Monkeypox DNA  neg Studies/Results: ECHOCARDIOGRAM COMPLETE  Result Date: 03/14/2021    ECHOCARDIOGRAM REPORT   Patient Name:   Randy Henry Date of Exam: 03/14/2021 Medical Rec #:  [id number]     Height:       71.0 in Accession #:    [id number]    Weight:       181.9 lb Date of Birth:  [birth year]     BSA:          2.025 m Patient Age:    20 years      BP:           140/73 mmHg Patient Gender: M             HR:           73 bpm. Exam Location:  [facility] Procedure: 2D Echo, Cardiac Doppler, Color Doppler and Strain Analysis Indications:     Bacteremia R78.81  History:         Patient has no prior history of Echocardiogram examinations.  Sonographer:     Neysa Bonito Roar Referring Phys:  [id number] Lurene Shadow Diagnosing Phys: Julien Nordmann MD IMPRESSIONS  1. No valve endocarditis noted  2. Left ventricular ejection fraction, by estimation, is 60 to 65%. The left ventricle has normal function. The left ventricle has no regional wall motion abnormalities. Left ventricular diastolic parameters were normal. The average left ventricular global longitudinal strain is -19.5 %. The global longitudinal strain is normal.  3. Right ventricular systolic function is normal. The right ventricular size is normal.  4. The mitral valve is normal in structure. No evidence of mitral valve regurgitation. No evidence of mitral stenosis.  5. The aortic valve is normal in structure. Aortic valve regurgitation is not visualized. No aortic stenosis is present.  6. The inferior vena cava is normal in size with greater than 50% respiratory variability, suggesting right atrial pressure of 3 mmHg. FINDINGS  Left Ventricle: Left ventricular ejection fraction, by estimation, is 60 to 65%. The left ventricle has normal function. The left ventricle has no regional wall motion abnormalities. The average left ventricular global longitudinal strain is -19.5 %. The global longitudinal strain is normal. The left ventricular internal cavity size was normal in size. There is  no left ventricular hypertrophy. Left ventricular diastolic parameters were normal. Right Ventricle: The right ventricular size is normal. No increase in right ventricular wall thickness. Right ventricular systolic function is normal. Left Atrium: Left atrial size was normal in size. Right Atrium: Right atrial size was normal in size. Pericardium: There is no evidence of pericardial effusion. Mitral Valve: The mitral valve is normal in structure. No evidence of mitral valve regurgitation. No evidence of mitral valve stenosis. Tricuspid Valve: The tricuspid valve is normal in structure. Tricuspid valve regurgitation is not demonstrated. No evidence of tricuspid stenosis. Aortic Valve: The aortic valve is normal in structure. Aortic valve regurgitation is not visualized. No aortic stenosis is present. Aortic valve peak gradient measures 6.4 mmHg. Pulmonic Valve: The pulmonic valve was normal in structure. Pulmonic valve regurgitation is not visualized. No evidence of pulmonic stenosis. Aorta: The aortic root is normal in size and structure. Venous: The inferior vena cava is normal in size with greater than 50% respiratory variability, suggesting right atrial pressure of 3 mmHg. IAS/Shunts: No atrial level shunt detected by color flow Doppler.  LEFT VENTRICLE PLAX 2D LVIDd:         4.60 cm  Diastology LVIDs:         3.40 cm  LV e' medial:    10.80 cm/s LV PW:         1.00 cm  LV E/e' medial:  7.0 LV IVS:        1.00 cm  LV e' lateral:   11.50 cm/s LVOT diam:     2.50 cm  LV E/e' lateral: 6.6 LVOT Area:     4.91 cm                         2D Longitudinal Strain                         2D Strain GLS Avg:     -19.5 % RIGHT VENTRICLE RV Basal diam:  3.30 cm RV Mid diam:    2.80 cm RV S prime:     14.00 cm/s TAPSE (M-mode): 2.3 cm LEFT ATRIUM             Index       RIGHT ATRIUM           Index LA diam:        3.30 cm 1.63 cm/m  RA Area:     13.50 cm LA Vol (A2C):   41.5 ml 20.49 ml/m RA Volume:   32.00 ml  15.80 ml/m  LA Vol (A4C):   30.8 ml 15.21 ml/m LA Biplane Vol: 38.8 ml 19.16 ml/m  AORTIC VALVE  PULMONIC VALVE AV Area (Vmax): 4.44 cm    PV Vmax:        0.87 m/s AV Vmax:        126.00 cm/s PV Peak grad:   3.0 mmHg AV Peak Grad:   6.4 mmHg    RVOT Peak grad: 3 mmHg LVOT Vmax:      114.00 cm/s  AORTA Ao Root diam: 2.80 cm MITRAL VALVE MV Area (PHT): 3.30 cm    SHUNTS MV Decel Time: 230 msec    Systemic Diam: 2.50 cm MV E velocity: 75.40 cm/s MV A velocity: 38.10 cm/s MV E/A ratio:  1.98 MV A Prime:    8.3 cm/s Julien Nordmann MD Electronically signed by Julien Nordmann MD Signature Date/Time: 03/14/2021/7:07:33 PM    Final      Assessment/Plan: MSSA abcteremia MSSA wounds of the skin IVDA Needs TEE to r/o endocarditis If TEE neg then can be treated with 2 weeks of IV cefazolin. If TEE positive for endocarditis will need for 6 weeks  Monkey pox test negative- DC airborne

## 2021-03-16 ENCOUNTER — Encounter
Admission: EM | Disposition: A | Discharge status: Home / Self Care | Payer: Self-pay | Source: Home / Self Care | Attending: Internal Medicine

## 2021-03-16 ENCOUNTER — Inpatient Hospital Stay (HOSPITAL_COMMUNITY)
Admit: 2021-03-16 | Discharge: 2021-03-16 | Disposition: A | Discharge status: Home / Self Care | Payer: Medicaid Other | Attending: Internal Medicine | Admitting: Internal Medicine

## 2021-03-16 DIAGNOSIS — R7881 Bacteremia: Secondary | ICD-10-CM | POA: Diagnosis not present

## 2021-03-16 DIAGNOSIS — I471 Supraventricular tachycardia: Secondary | ICD-10-CM | POA: Diagnosis not present

## 2021-03-16 DIAGNOSIS — A4101 Sepsis due to Methicillin susceptible Staphylococcus aureus: Secondary | ICD-10-CM | POA: Diagnosis not present

## 2021-03-16 DIAGNOSIS — B9561 Methicillin susceptible Staphylococcus aureus infection as the cause of diseases classified elsewhere: Secondary | ICD-10-CM | POA: Diagnosis not present

## 2021-03-16 HISTORY — PX: TEE WITHOUT CARDIOVERSION: SHX5443

## 2021-03-16 LAB — CULTURE, BLOOD (ROUTINE X 2): Special Requests: ADEQUATE

## 2021-03-16 LAB — GLUCOSE, CAPILLARY
Glucose-Capillary: 128 mg/dL — ABNORMAL HIGH (ref 70–99)
Glucose-Capillary: 99 mg/dL (ref 70–99)
Glucose-Capillary: 139 mg/dL — ABNORMAL HIGH (ref 70–99)

## 2021-03-16 SURGERY — ECHOCARDIOGRAM, TRANSESOPHAGEAL
Anesthesia: Moderate Sedation

## 2021-03-16 MED ORDER — SODIUM CHLORIDE 0.9 % IV SOLN: INTRAVENOUS | Status: DC

## 2021-03-16 MED ORDER — SODIUM CHLORIDE 0.9 % IV SOLN: INTRAVENOUS | Status: DC | PRN

## 2021-03-16 MED ORDER — MIDAZOLAM HCL 2 MG/2ML IJ SOLN
INTRAMUSCULAR | Status: DC | PRN
  Administered 2021-03-16 (×2): 1 mg via INTRAVENOUS
  Administered 2021-03-16: 2 mg via INTRAVENOUS
  Administered 2021-03-16 (×2): 1 mg via INTRAVENOUS

## 2021-03-16 MED ORDER — MIDAZOLAM HCL 2 MG/2ML IJ SOLN
INTRAMUSCULAR | Status: AC
  Filled 2021-03-16: qty 2

## 2021-03-16 MED ORDER — MIDAZOLAM HCL 2 MG/2ML IJ SOLN
INTRAMUSCULAR | Status: AC
  Filled 2021-03-16: qty 4

## 2021-03-16 MED ORDER — BUTAMBEN-TETRACAINE-BENZOCAINE 2-2-14 % EX AERO
INHALATION_SPRAY | CUTANEOUS | Status: AC
  Filled 2021-03-16: qty 5

## 2021-03-16 MED ORDER — LIDOCAINE VISCOUS HCL 2 % MT SOLN
OROMUCOSAL | Status: AC
  Filled 2021-03-16: qty 15

## 2021-03-16 MED ORDER — FENTANYL CITRATE (PF) 100 MCG/2ML IJ SOLN
INTRAMUSCULAR | Status: DC | PRN
  Administered 2021-03-16 (×5): 25 ug via INTRAVENOUS

## 2021-03-16 MED ORDER — FENTANYL CITRATE (PF) 100 MCG/2ML IJ SOLN
INTRAMUSCULAR | Status: AC
  Filled 2021-03-16: qty 2

## 2021-03-16 MED ORDER — CEFAZOLIN SODIUM-DEXTROSE 2-4 GM/100ML-% IV SOLN
2.0000 g | Freq: Three times a day (TID) | INTRAVENOUS | Status: DC
  Administered 2021-03-16 – 2021-03-25 (×27): 2 g via INTRAVENOUS
  Filled 2021-03-16 (×30): qty 100

## 2021-03-16 MED ORDER — SODIUM CHLORIDE FLUSH 0.9 % IV SOLN
INTRAVENOUS | Status: AC
  Filled 2021-03-16: qty 10

## 2021-03-16 NOTE — Progress Notes (Signed)
*  PRELIMINARY RESULTS* Echocardiogram Echocardiogram Transesophageal has been performed.  Cristela Blue 03/16/2021, 8:43 AM

## 2021-03-16 NOTE — Interval H&P Note (Signed)
History and Physical Interval Note:  03/16/2021 8:04 AM  Randy Henry  has presented today for surgery, with the diagnosis of Sepsis.  The various methods of treatment have been discussed with the patient and family. After consideration of risks, benefits and other options for treatment, the patient has consented to  Procedure(s): TRANSESOPHAGEAL ECHOCARDIOGRAM (TEE) (N/A) as a surgical intervention.  The patient's history has been reviewed, patient examined, no change in status, stable for surgery.  I have reviewed the patient's chart and labs.  Questions were answered to the patient's satisfaction.     [name]

## 2021-03-16 NOTE — Plan of Care (Signed)
  Problem: Clinical Measurements: Goal: Ability to maintain clinical measurements within normal limits will improve Outcome: Progressing Goal: Will remain free from infection Outcome: Progressing Goal: Diagnostic test results will improve Outcome: Progressing Goal: Respiratory complications will improve Outcome: Progressing Goal: Cardiovascular complication will be avoided Outcome: Progressing   Problem: Pain Managment: Goal: General experience of comfort will improve Outcome: Progressing   Pt is involved in and agrees with the plan of care. V/S stable. Denies any pain.  

## 2021-03-16 NOTE — CV Procedure (Signed)
    Transesophageal Echocardiogram Note  Randy Henry [id number] October 21, 2000  Procedure: Transesophageal Echocardiogram Indications: Staph bacteremia  Procedure Details Consent: Obtained Time Out: Verified patient identification, verified procedure, site/side was marked, verified correct patient position, special equipment/implants available, Radiology Safety Procedures followed,  medications/allergies/relevent history reviewed, required imaging and test results available.  Performed  Medications:  During this procedure the patient is administered a total of Versed 6 mg and Fentanyl 125 mcg to achieve and maintain moderate conscious sedation.  The patient's heart rate, blood pressure, and oxygen saturation are monitored continuously during the procedure. The period of conscious sedation is 25 minutes, of which I was present face-to-face 100% of this time.  Left Ventrical:  Normal size and function  Mitral Valve: Normal with trivial MR  Aortic Valve: Trileaflet with trivial AI.  Question tiny vegetation versus Lambl's excresence.  Tricuspid Valve: Normal with trivial TR.  Pulmonic Valve: Normal with trivial PR.  Left Atrium/ Left atrial appendage: No thrombus.  Atrial septum: Intact by color Doppler (bubble study not attempted due to difficulty maintaining adequate sedation).  Aorta: Normal.  Complications: No apparent complications Patient did tolerate procedure well.  Yvonne Kendall, MD 03/16/2021, 8:42 AM

## 2021-03-16 NOTE — Consult Note (Signed)
Cardiology Consultation:   Patient ID: Randy Henry MRN: [id number]; DOB: 10/28/2000  Admit date: 03/11/2021 Date of Consult: 03/16/2021  PCP:  Bonney, Warren K, MD   [facility] Providers Cardiologist:  None  Patient Profile:   Randy Henry is a 20 y.o. male with a hx of atopic eczema who is being seen 03/16/2021 for the evaluation of staph bacteremia at the request of Dr. Ayiku.  History of Present Illness:   Randy Henry presented to the [facility] ED on 03/11/21 with arm rash.  He thinks he may have had spider bites.  He denies a history of IV drug use, though ID consultation indicates that he has used IV drugs in the past.  Urine drug screen was positive for amphetamines.  Patient denies chest pain, shortness of breath, palpitations, fevers, and chills.  He denies dysphagia and prior esophageal/gastric surgery.   Past Medical History:  Diagnosis Date   ADHD (attention deficit hyperactivity disorder)     History reviewed. No pertinent surgical history.   Home Medications:  Prior to Admission medications   Medication Sig Start Date Randy Henry Date Taking? Authorizing Provider  predniSONE (STERAPRED UNI-PAK 21 TAB) 5 MG (21) TBPK tablet Take 5-30 mg by mouth See admin instructions. follow package directions 03/05/21  Yes [provider]  Amphetamine Sulfate (EVEKEO) 10 MG TABS Take 10-20 mg by mouth daily. 12/11/19   Paretta-Leahey, Dawn M, NP  sertraline (ZOLOFT) 25 MG tablet TAKE 2 TABLETS(50 MG) BY MOUTH DAILY Patient not taking: No sig reported 08/10/20   Dedlow, Edna R, NP  triamcinolone ointment (KENALOG) 0.1 % APP EXT AA BID UNTIL CLEAR THEN PRN 02/13/18   [provider]  valACYclovir (VALTREX) 500 MG tablet Take 500 mg by mouth daily. 11/10/16   [provider]  levocetirizine (XYZAL) 5 MG tablet TAKE 1 TABLET PO EVERY EVENING Patient not taking: No sig reported 11/21/17 07/08/20  [provider]    Inpatient Medications: Scheduled Meds:   butamben-tetracaine-benzocaine       [MAR Hold] diltiazem  120 mg Oral Daily   [MAR Hold] enoxaparin (LOVENOX) injection  40 mg Subcutaneous Q24H   [MAR Hold] feeding supplement  237 mL Oral TID BM   fentaNYL       [MAR Hold] insulin aspart  0-5 Units Subcutaneous QHS   [MAR Hold] insulin aspart  0-9 Units Subcutaneous TID WC   lidocaine       midazolam       [MAR Hold] sertraline  50 mg Oral Daily   sodium chloride flush       [MAR Hold] triamcinolone ointment   Topical TID   [MAR Hold] valACYclovir  500 mg Oral BID   Continuous Infusions:  [MAR Hold] sodium chloride 10 mL/hr at 03/16/21 0739   [MAR Hold]  ceFAZolin (ANCEF) IV Stopped (03/16/21 0326)   PRN Meds: [MAR Hold] sodium chloride, [MAR Hold] acetaminophen, [MAR Hold] melatonin, [MAR Hold] ondansetron (ZOFRAN) IV, [MAR Hold] oxyCODONE, [MAR Hold] polyethylene glycol  Allergies:    Allergies  Allergen Reactions   Shellfish-Derived Products     Social History:   Social History   Tobacco Use   Smoking status: Passive Smoke Exposure - Never Smoker   Smokeless tobacco: Never  Substance Use Topics   Alcohol use: No     Family History:   Family History  Problem Relation Age of Onset   ADD / ADHD Mother    Cancer Maternal Grandfather      ROS:  Please   see the history of present illness.  All other ROS reviewed and negative.     Physical Exam/Data:   Vitals:   03/15/21 2015 03/16/21 0404 03/16/21 0718 03/16/21 0729  BP: 124/82 119/73 131/68 136/83  Pulse: 84 83 98 85  Resp: 18 18 16 17   Temp: 98.4 F (36.9 C) 98.5 F (36.9 C) (!) 97.5 F (36.4 C) 98 F (36.7 C)  TempSrc: Oral Oral Oral Oral  SpO2: 98% 99% 100% 98%  Weight:      Height:        Intake/Output Summary (Last 24 hours) at 03/16/2021 0756 Last data filed at 03/16/2021 0625 Gross per 24 hour  Intake 839.68 ml  Output --  Net 839.68 ml   Last 3 Weights 03/11/2021 03/11/2021 09/06/2019  Weight (lbs) 181 lb 14.1 oz 198 lb 196 lb  Weight (kg)  82.5 kg 89.812 kg 88.905 kg  Some encounter information is confidential and restricted. Go to Review Flowsheets activity to see all data.     Body mass index is 25.37 kg/m.  General:  Well nourished, well developed, in no acute distress HEENT: normal Neck: no JVD Vascular: No carotid bruits; Distal pulses 2+ bilaterally Cardiac:  normal S1, S2; RRR; no murmurs, rubs, or gallops Lungs:  clear to auscultation bilaterally, no wheezing, rhonchi or rales  Abd: soft, nontender, no hepatomegaly  Ext: no edema Musculoskeletal:  No deformities, BUE and BLE strength normal and equal Skin: multiple superficial wounds and scabs on arms and legs Neuro:  CNs 2-12 intact, no focal abnormalities noted Psych:  Normal affect   EKG:  The EKG was personally reviewed and demonstrates:  NSR w/o abnormality.  Relevant CV Studies: See TTE below.  Laboratory Data:  High Sensitivity Troponin:  No results for input(s): TROPONINIHS in the last 720 hours.   Chemistry Recent Labs  Lab 03/11/21 1545 03/11/21 1746 03/12/21 0436 03/13/21 0444  NA 135  --  136 139  K 3.4*  --  3.7 3.6  CL 99  --  103 102  CO2 21*  --  27 30  GLUCOSE 167*  --  107* 84  BUN 13  --  9 5*  CREATININE 1.06  --  0.94 0.99  CALCIUM 9.6  --  8.7* 8.7*  MG  --  2.2  --  2.2  GFRNONAA >60  --  >60 >60  ANIONGAP 15  --  6 7    Recent Labs  Lab 03/11/21 1545  PROT 7.8  ALBUMIN 4.7  AST 40  ALT 52*  ALKPHOS 80  BILITOT 0.9   Lipids No results for input(s): CHOL, TRIG, HDL, LABVLDL, LDLCALC, CHOLHDL in the last 168 hours.  Hematology Recent Labs  Lab 03/11/21 1545 03/12/21 0436  WBC 19.7* 8.5  RBC 5.29 4.63  HGB 16.6 14.0  HCT 44.8 39.8  MCV 84.7 86.0  MCH 31.4 30.2  MCHC 37.1* 35.2  RDW 11.9 12.0  PLT 337 242   Thyroid  Recent Labs  Lab 03/11/21 1746  TSH 0.592  FREET4 1.00    BNPNo results for input(s): BNP, PROBNP in the last 168 hours.  DDimer No results for input(s): DDIMER in the last 168  hours.   Radiology/Studies:  DG Chest 2 View  Result Date: 03/12/2021 CLINICAL DATA:  Sepsis COVID EXAM: CHEST - 2 VIEW COMPARISON:  09/22/2010 FINDINGS: The heart size and mediastinal contours are within normal limits. Both lungs are clear. The visualized skeletal structures are unremarkable. IMPRESSION: No active cardiopulmonary  disease. Electronically Signed   By: Jasmine Pang M.D.   On: 03/12/2021 18:14   ECHOCARDIOGRAM COMPLETE  Result Date: 03/14/2021    ECHOCARDIOGRAM REPORT   Patient Name:   Randy Henry Date of Exam: 03/14/2021 Medical Rec #:  [id number]     Height:       71.0 in Accession #:    [id number]    Weight:       181.9 lb Date of Birth:  [birth year]     BSA:          2.025 m Patient Age:    20 years      BP:           140/73 mmHg Patient Gender: M             HR:           73 bpm. Exam Location:  [facility] Procedure: 2D Echo, Cardiac Doppler, Color Doppler and Strain Analysis Indications:     Bacteremia R78.81  History:         Patient has no prior history of Echocardiogram examinations.  Sonographer:     Neysa Bonito Roar Referring Phys:  [id number] Lurene Shadow Diagnosing Phys: Julien Nordmann MD IMPRESSIONS  1. No valve endocarditis noted  2. Left ventricular ejection fraction, by estimation, is 60 to 65%. The left ventricle has normal function. The left ventricle has no regional wall motion abnormalities. Left ventricular diastolic parameters were normal. The average left ventricular global longitudinal strain is -19.5 %. The global longitudinal strain is normal.  3. Right ventricular systolic function is normal. The right ventricular size is normal.  4. The mitral valve is normal in structure. No evidence of mitral valve regurgitation. No evidence of mitral stenosis.  5. The aortic valve is normal in structure. Aortic valve regurgitation is not visualized. No aortic stenosis is present.  6. The inferior vena cava is normal in size with greater than 50% respiratory variability, suggesting right  atrial pressure of 3 mmHg. FINDINGS  Left Ventricle: Left ventricular ejection fraction, by estimation, is 60 to 65%. The left ventricle has normal function. The left ventricle has no regional wall motion abnormalities. The average left ventricular global longitudinal strain is -19.5 %. The global longitudinal strain is normal. The left ventricular internal cavity size was normal in size. There is no left ventricular hypertrophy. Left ventricular diastolic parameters were normal. Right Ventricle: The right ventricular size is normal. No increase in right ventricular wall thickness. Right ventricular systolic function is normal. Left Atrium: Left atrial size was normal in size. Right Atrium: Right atrial size was normal in size. Pericardium: There is no evidence of pericardial effusion. Mitral Valve: The mitral valve is normal in structure. No evidence of mitral valve regurgitation. No evidence of mitral valve stenosis. Tricuspid Valve: The tricuspid valve is normal in structure. Tricuspid valve regurgitation is not demonstrated. No evidence of tricuspid stenosis. Aortic Valve: The aortic valve is normal in structure. Aortic valve regurgitation is not visualized. No aortic stenosis is present. Aortic valve peak gradient measures 6.4 mmHg. Pulmonic Valve: The pulmonic valve was normal in structure. Pulmonic valve regurgitation is not visualized. No evidence of pulmonic stenosis. Aorta: The aortic root is normal in size and structure. Venous: The inferior vena cava is normal in size with greater than 50% respiratory variability, suggesting right atrial pressure of 3 mmHg. IAS/Shunts: No atrial level shunt detected by color flow Doppler.  LEFT VENTRICLE PLAX 2D LVIDd:  4.60 cm  Diastology LVIDs:         3.40 cm  LV e' medial:    10.80 cm/s LV PW:         1.00 cm  LV E/e' medial:  7.0 LV IVS:        1.00 cm  LV e' lateral:   11.50 cm/s LVOT diam:     2.50 cm  LV E/e' lateral: 6.6 LVOT Area:     4.91 cm                          2D Longitudinal Strain                         2D Strain GLS Avg:     -19.5 % RIGHT VENTRICLE RV Basal diam:  3.30 cm RV Mid diam:    2.80 cm RV S prime:     14.00 cm/s TAPSE (M-mode): 2.3 cm LEFT ATRIUM             Index       RIGHT ATRIUM           Index LA diam:        3.30 cm 1.63 cm/m  RA Area:     13.50 cm LA Vol (A2C):   41.5 ml 20.49 ml/m RA Volume:   32.00 ml  15.80 ml/m LA Vol (A4C):   30.8 ml 15.21 ml/m LA Biplane Vol: 38.8 ml 19.16 ml/m  AORTIC VALVE                PULMONIC VALVE AV Area (Vmax): 4.44 cm    PV Vmax:        0.87 m/s AV Vmax:        126.00 cm/s PV Peak grad:   3.0 mmHg AV Peak Grad:   6.4 mmHg    RVOT Peak grad: 3 mmHg LVOT Vmax:      114.00 cm/s  AORTA Ao Root diam: 2.80 cm MITRAL VALVE MV Area (PHT): 3.30 cm    SHUNTS MV Decel Time: 230 msec    Systemic Diam: 2.50 cm MV E velocity: 75.40 cm/s MV A velocity: 38.10 cm/s MV E/A ratio:  1.98 MV A Prime:    8.3 cm/s Julien Nordmann MD Electronically signed by Julien Nordmann MD Signature Date/Time: 03/14/2021/7:07:33 PM    Final      Assessment and Plan:   Bacteremia: Patient with multiple skin lesions and found to have MSSA bacteremia.  TEE has been requested, as TTE did not show evidence of endocarditis.  It is appropriate to proceed with TEE.  Shared Decision Making/Informed Consent The risks [esophageal damage, perforation (1:10,000 risk), bleeding, pharyngeal hematoma as well as other potential complications associated with conscious sedation including aspiration, arrhythmia, respiratory failure and death], benefits (treatment guidance and diagnostic support) and alternatives of a transesophageal echocardiogram were discussed in detail with Randy Henry and he is willing to proceed.    For questions or updates, please contact [facility] Please consult [email or web address] for contact info under [facility].  Signed, Yvonne Kendall, MD  03/16/2021 7:56 AM

## 2021-03-16 NOTE — H&P (View-Only) (Signed)
Cardiology Consultation:   Patient ID: Randy Henry MRN: [id number]; DOB: [birth year]  Admit date: 03/11/2021 Date of Consult: 03/16/2021  PCP:  [name], MD   [facility] Providers Cardiologist:  None  Patient Profile:   Randy Henry is a 20 y.o. male with a hx of atopic eczema who is being seen 03/16/2021 for the evaluation of staph bacteremia at the request of Dr. Myriam Forehand.  History of Present Illness:   Mr. Randy Henry presented to the [facility] ED on 03/11/21 with arm rash.  He thinks he may have had spider bites.  He denies a history of IV drug use, though ID consultation indicates that he has used IV drugs in the past.  Urine drug screen was positive for amphetamines.  Patient denies chest pain, shortness of breath, palpitations, fevers, and chills.  He denies dysphagia and prior esophageal/gastric surgery.   Past Medical History:  Diagnosis Date   ADHD (attention deficit hyperactivity disorder)     History reviewed. No pertinent surgical history.   Home Medications:  Prior to Admission medications   Medication Sig Start Date Latresha Yahr Date Taking? Authorizing Provider  predniSONE (STERAPRED UNI-PAK 21 TAB) 5 MG (21) TBPK tablet Take 5-30 mg by mouth See admin instructions. follow package directions 03/05/21  Yes [provider]  Amphetamine Sulfate (EVEKEO) 10 MG TABS Take 10-20 mg by mouth daily. 12/11/19   Paretta-Leahey, Miachel Roux, NP  sertraline (ZOLOFT) 25 MG tablet TAKE 2 TABLETS(50 MG) BY MOUTH DAILY Patient not taking: No sig reported 08/10/20   Lorina Rabon, NP  triamcinolone ointment (KENALOG) 0.1 % APP EXT AA BID UNTIL CLEAR THEN PRN 02/13/18   [provider]  valACYclovir (VALTREX) 500 MG tablet Take 500 mg by mouth daily. 11/10/16   [provider]  levocetirizine (XYZAL) 5 MG tablet TAKE 1 TABLET PO EVERY EVENING Patient not taking: No sig reported 11/21/17 07/08/20  [provider]    Inpatient Medications: Scheduled Meds:   butamben-tetracaine-benzocaine       [MAR Hold] diltiazem  120 mg Oral Daily   [MAR Hold] enoxaparin (LOVENOX) injection  40 mg Subcutaneous Q24H   [MAR Hold] feeding supplement  237 mL Oral TID BM   fentaNYL       [MAR Hold] insulin aspart  0-5 Units Subcutaneous QHS   [MAR Hold] insulin aspart  0-9 Units Subcutaneous TID WC   lidocaine       midazolam       [MAR Hold] sertraline  50 mg Oral Daily   sodium chloride flush       [MAR Hold] triamcinolone ointment   Topical TID   [MAR Hold] valACYclovir  500 mg Oral BID   Continuous Infusions:  [MAR Hold] sodium chloride 10 mL/hr at 03/16/21 0739   [MAR Hold]  ceFAZolin (ANCEF) IV Stopped (03/16/21 0326)   PRN Meds: [TML Hold] sodium chloride, [MAR Hold] acetaminophen, [MAR Hold] melatonin, [MAR Hold] ondansetron (ZOFRAN) IV, [MAR Hold] oxyCODONE, [MAR Hold] polyethylene glycol  Allergies:    Allergies  Allergen Reactions   Shellfish-Derived Products     Social History:   Social History   Tobacco Use   Smoking status: Passive Smoke Exposure - Never Smoker   Smokeless tobacco: Never  Substance Use Topics   Alcohol use: No     Family History:   Family History  Problem Relation Age of Onset   ADD / ADHD Mother    Cancer Maternal Grandfather      ROS:  Please  see the history of present illness.  All other ROS reviewed and negative.     Physical Exam/Data:   Vitals:   03/15/21 2015 03/16/21 0404 03/16/21 0718 03/16/21 0729  BP: 124/82 119/73 131/68 136/83  Pulse: 84 83 98 85  Resp: 18 18 16 17   Temp: 98.4 F (36.9 C) 98.5 F (36.9 C) (!) 97.5 F (36.4 C) 98 F (36.7 C)  TempSrc: Oral Oral Oral Oral  SpO2: 98% 99% 100% 98%  Weight:      Height:        Intake/Output Summary (Last 24 hours) at 03/16/2021 0756 Last data filed at 03/16/2021 0625 Gross per 24 hour  Intake 839.68 ml  Output --  Net 839.68 ml   Last 3 Weights 03/11/2021 03/11/2021 09/06/2019  Weight (lbs) 181 lb 14.1 oz 198 lb 196 lb  Weight (kg)  82.5 kg 89.812 kg 88.905 kg  Some encounter information is confidential and restricted. Go to Review Flowsheets activity to see all data.     Body mass index is 25.37 kg/m.  General:  Well nourished, well developed, in no acute distress HEENT: normal Neck: no JVD Vascular: No carotid bruits; Distal pulses 2+ bilaterally Cardiac:  normal S1, S2; RRR; no murmurs, rubs, or gallops Lungs:  clear to auscultation bilaterally, no wheezing, rhonchi or rales  Abd: soft, nontender, no hepatomegaly  Ext: no edema Musculoskeletal:  No deformities, BUE and BLE strength normal and equal Skin: multiple superficial wounds and scabs on arms and legs Neuro:  CNs 2-12 intact, no focal abnormalities noted Psych:  Normal affect   EKG:  The EKG was personally reviewed and demonstrates:  NSR w/o abnormality.  Relevant CV Studies: See TTE below.  Laboratory Data:  High Sensitivity Troponin:  No results for input(s): TROPONINIHS in the last 720 hours.   Chemistry Recent Labs  Lab 03/11/21 1545 03/11/21 1746 03/12/21 0436 03/13/21 0444  NA 135  --  136 139  K 3.4*  --  3.7 3.6  CL 99  --  103 102  CO2 21*  --  27 30  GLUCOSE 167*  --  107* 84  BUN 13  --  9 5*  CREATININE 1.06  --  0.94 0.99  CALCIUM 9.6  --  8.7* 8.7*  MG  --  2.2  --  2.2  GFRNONAA >60  --  >60 >60  ANIONGAP 15  --  6 7    Recent Labs  Lab 03/11/21 1545  PROT 7.8  ALBUMIN 4.7  AST 40  ALT 52*  ALKPHOS 80  BILITOT 0.9   Lipids No results for input(s): CHOL, TRIG, HDL, LABVLDL, LDLCALC, CHOLHDL in the last 168 hours.  Hematology Recent Labs  Lab 03/11/21 1545 03/12/21 0436  WBC 19.7* 8.5  RBC 5.29 4.63  HGB 16.6 14.0  HCT 44.8 39.8  MCV 84.7 86.0  MCH 31.4 30.2  MCHC 37.1* 35.2  RDW 11.9 12.0  PLT 337 242   Thyroid  Recent Labs  Lab 03/11/21 1746  TSH 0.592  FREET4 1.00    BNPNo results for input(s): BNP, PROBNP in the last 168 hours.  DDimer No results for input(s): DDIMER in the last 168  hours.   Radiology/Studies:  DG Chest 2 View  Result Date: 03/12/2021 CLINICAL DATA:  Sepsis COVID EXAM: CHEST - 2 VIEW COMPARISON:  09/22/2010 FINDINGS: The heart size and mediastinal contours are within normal limits. Both lungs are clear. The visualized skeletal structures are unremarkable. IMPRESSION: No active cardiopulmonary  disease. Electronically Signed   By: Jasmine Pang M.D.   On: 03/12/2021 18:14   ECHOCARDIOGRAM COMPLETE  Result Date: 03/14/2021    ECHOCARDIOGRAM REPORT   Patient Name:   URIAN MARTENSON Date of Exam: 03/14/2021 Medical Rec #:  [id number]     Height:       71.0 in Accession #:    [id number]    Weight:       181.9 lb Date of Birth:  [birth year]     BSA:          2.025 m Patient Age:    20 years      BP:           140/73 mmHg Patient Gender: M             HR:           73 bpm. Exam Location:  [facility] Procedure: 2D Echo, Cardiac Doppler, Color Doppler and Strain Analysis Indications:     Bacteremia R78.81  History:         Patient has no prior history of Echocardiogram examinations.  Sonographer:     Neysa Bonito Roar Referring Phys:  [id number] Lurene Shadow Diagnosing Phys: Julien Nordmann MD IMPRESSIONS  1. No valve endocarditis noted  2. Left ventricular ejection fraction, by estimation, is 60 to 65%. The left ventricle has normal function. The left ventricle has no regional wall motion abnormalities. Left ventricular diastolic parameters were normal. The average left ventricular global longitudinal strain is -19.5 %. The global longitudinal strain is normal.  3. Right ventricular systolic function is normal. The right ventricular size is normal.  4. The mitral valve is normal in structure. No evidence of mitral valve regurgitation. No evidence of mitral stenosis.  5. The aortic valve is normal in structure. Aortic valve regurgitation is not visualized. No aortic stenosis is present.  6. The inferior vena cava is normal in size with greater than 50% respiratory variability, suggesting right  atrial pressure of 3 mmHg. FINDINGS  Left Ventricle: Left ventricular ejection fraction, by estimation, is 60 to 65%. The left ventricle has normal function. The left ventricle has no regional wall motion abnormalities. The average left ventricular global longitudinal strain is -19.5 %. The global longitudinal strain is normal. The left ventricular internal cavity size was normal in size. There is no left ventricular hypertrophy. Left ventricular diastolic parameters were normal. Right Ventricle: The right ventricular size is normal. No increase in right ventricular wall thickness. Right ventricular systolic function is normal. Left Atrium: Left atrial size was normal in size. Right Atrium: Right atrial size was normal in size. Pericardium: There is no evidence of pericardial effusion. Mitral Valve: The mitral valve is normal in structure. No evidence of mitral valve regurgitation. No evidence of mitral valve stenosis. Tricuspid Valve: The tricuspid valve is normal in structure. Tricuspid valve regurgitation is not demonstrated. No evidence of tricuspid stenosis. Aortic Valve: The aortic valve is normal in structure. Aortic valve regurgitation is not visualized. No aortic stenosis is present. Aortic valve peak gradient measures 6.4 mmHg. Pulmonic Valve: The pulmonic valve was normal in structure. Pulmonic valve regurgitation is not visualized. No evidence of pulmonic stenosis. Aorta: The aortic root is normal in size and structure. Venous: The inferior vena cava is normal in size with greater than 50% respiratory variability, suggesting right atrial pressure of 3 mmHg. IAS/Shunts: No atrial level shunt detected by color flow Doppler.  LEFT VENTRICLE PLAX 2D LVIDd:  4.60 cm  Diastology LVIDs:         3.40 cm  LV e' medial:    10.80 cm/s LV PW:         1.00 cm  LV E/e' medial:  7.0 LV IVS:        1.00 cm  LV e' lateral:   11.50 cm/s LVOT diam:     2.50 cm  LV E/e' lateral: 6.6 LVOT Area:     4.91 cm                          2D Longitudinal Strain                         2D Strain GLS Avg:     -19.5 % RIGHT VENTRICLE RV Basal diam:  3.30 cm RV Mid diam:    2.80 cm RV S prime:     14.00 cm/s TAPSE (M-mode): 2.3 cm LEFT ATRIUM             Index       RIGHT ATRIUM           Index LA diam:        3.30 cm 1.63 cm/m  RA Area:     13.50 cm LA Vol (A2C):   41.5 ml 20.49 ml/m RA Volume:   32.00 ml  15.80 ml/m LA Vol (A4C):   30.8 ml 15.21 ml/m LA Biplane Vol: 38.8 ml 19.16 ml/m  AORTIC VALVE                PULMONIC VALVE AV Area (Vmax): 4.44 cm    PV Vmax:        0.87 m/s AV Vmax:        126.00 cm/s PV Peak grad:   3.0 mmHg AV Peak Grad:   6.4 mmHg    RVOT Peak grad: 3 mmHg LVOT Vmax:      114.00 cm/s  AORTA Ao Root diam: 2.80 cm MITRAL VALVE MV Area (PHT): 3.30 cm    SHUNTS MV Decel Time: 230 msec    Systemic Diam: 2.50 cm MV E velocity: 75.40 cm/s MV A velocity: 38.10 cm/s MV E/A ratio:  1.98 MV A Prime:    8.3 cm/s Julien Nordmann MD Electronically signed by Julien Nordmann MD Signature Date/Time: 03/14/2021/7:07:33 PM    Final      Assessment and Plan:   Bacteremia: Patient with multiple skin lesions and found to have MSSA bacteremia.  TEE has been requested, as TTE did not show evidence of endocarditis.  It is appropriate to proceed with TEE.  Shared Decision Making/Informed Consent The risks [esophageal damage, perforation (1:10,000 risk), bleeding, pharyngeal hematoma as well as other potential complications associated with conscious sedation including aspiration, arrhythmia, respiratory failure and death], benefits (treatment guidance and diagnostic support) and alternatives of a transesophageal echocardiogram were discussed in detail with Mr. Mcpeek and he is willing to proceed.    For questions or updates, please contact [facility] Please consult [email or web address] for contact info under [facility].  Signed, Yvonne Kendall, MD  03/16/2021 7:56 AM

## 2021-03-16 NOTE — Progress Notes (Addendum)
Progress Note    Randy Henry  RKY:[id number] DOB: 06/15/2001  DOA: 03/11/2021 PCP: [name], MD      Brief Narrative:    Medical records reviewed and are as summarized below:  Randy Henry is a 20 y.o. male with medical history significant for COVID-19 infection in January 2022, atopic eczema, ADHD, substance use disorder, asthma, who presented to the hospital because of multiple skin lesions on his upper extremities.  He was found to have severe sepsis secondary to MSSA bacteremia.  Left upper extremity cellulitis was suspected as well.  Blood culture revealed MSSA.  TEE was concerning for aortic valve vegetation.  ID recommends IV cefazolin for a total of 6 weeks.    He developed SVT during his stay in the hospital so he was started on oral Cardizem  Assessment/Plan:   Principal Problem:   Sepsis (HCC) Active Problems:   Bacteremia due to Staphylococcus aureus   SVT (supraventricular tachycardia) (HCC)   Nutrition Problem: Inadequate oral intake Etiology: acute illness  Signs/Symptoms: per patient/family report   Body mass index is 25.37 kg/m.   Severe sepsis secondary to MSSA bacteremia and ?left upper extremity cellulitis, suspected aortic valve endocarditis: Lactic acid was 5.3 on admission.  Continue IV cefazolin.  ID recommends total of 6 weeks of antibiotics. TEE done on 03/16/2021 showed findings suspicious for vegetation on the aortic valve.   Atopic eczema, multiple scabbed lesions/rash on bilateral upper extremities, face, chest and back:  Monkey pox viral DNA test was negative.  Contact airborne isolation has been discontinued.  S/p SVT on 03/12/2021: Continue long-acting Cardizem for rate control  Hypokalemia: Improved.   ADHD: His mother said he does not take Evekeo at home and that he is medically nonadherent.  Substance use disorder: Patient said he uses cocaine and CBD Gummies.  He said he last used cocaine about a week prior to  admission.  He said he does not use methadone but his cocaine may have been laced with methadone.  He said he does not use any IV drugs.   Mother said patient had "snorted" prednisone and amphetamines on 03/10/2021.  Reportedly, the prednisone was prescribed for the new lesions that he had developed on his upper extremities.  Plan of care was discussed with the patient and his mother at the bedside.  I emphasized the importance of avoiding illicit drugs.      Diet Order             Diet regular Room service appropriate? Yes; Fluid consistency: Thin  Diet effective now                      Consultants: Infectious disease Cardiologist  Procedures: None    Medications:    diltiazem  120 mg Oral Daily   enoxaparin (LOVENOX) injection  40 mg Subcutaneous Q24H   feeding supplement  237 mL Oral TID BM   fentaNYL       fentaNYL       insulin aspart  0-5 Units Subcutaneous QHS   insulin aspart  0-9 Units Subcutaneous TID WC   lidocaine       midazolam       midazolam       sertraline  50 mg Oral Daily   sodium chloride flush       triamcinolone ointment   Topical TID   valACYclovir  500 mg Oral BID   Continuous Infusions:  sodium chloride 10  mL/hr at 03/16/21 0739    ceFAZolin (ANCEF) IV       Anti-infectives (From admission, onward)    Start     Dose/Rate Route Frequency Ordered Stop   03/16/21 1830  ceFAZolin (ANCEF) IVPB 2g/100 mL premix        2 g 200 mL/hr over 30 Minutes Intravenous Every 8 hours 03/16/21 1254     03/15/21 1045  valACYclovir (VALTREX) tablet 500 mg        500 mg Oral 2 times daily 03/15/21 0945     03/12/21 1200  vancomycin (VANCOREADY) IVPB 1750 mg/350 mL  Status:  Discontinued        1,750 mg 175 mL/hr over 120 Minutes Intravenous Every 12 hours 03/12/21 0742 03/12/21 1006   03/12/21 1100  vancomycin (VANCOREADY) IVPB 1500 mg/300 mL  Status:  Discontinued        1,500 mg 150 mL/hr over 120 Minutes Intravenous Every 12 hours 03/11/21  2150 03/12/21 0742   03/12/21 1100  ceFAZolin (ANCEF) IVPB 2g/100 mL premix  Status:  Discontinued        2 g 200 mL/hr over 30 Minutes Intravenous Every 8 hours 03/12/21 1006 03/16/21 1254   03/12/21 0000  ceFEPIme (MAXIPIME) 2 g in sodium chloride 0.9 % 100 mL IVPB  Status:  Discontinued        2 g 200 mL/hr over 30 Minutes Intravenous Every 8 hours 03/11/21 2037 03/12/21 1006   03/11/21 2200  vancomycin (VANCOCIN) IVPB 1000 mg/200 mL premix        1,000 mg 200 mL/hr over 60 Minutes Intravenous  Once 03/11/21 2037 03/12/21 0154   03/11/21 1745  ceFEPIme (MAXIPIME) 2 g in sodium chloride 0.9 % 100 mL IVPB        2 g 200 mL/hr over 30 Minutes Intravenous  Once 03/11/21 1733 03/11/21 1831   03/11/21 1745  metroNIDAZOLE (FLAGYL) IVPB 500 mg        500 mg 100 mL/hr over 60 Minutes Intravenous  Once 03/11/21 1733 03/11/21 1942   03/11/21 1745  vancomycin (VANCOCIN) IVPB 1000 mg/200 mL premix        1,000 mg 200 mL/hr over 60 Minutes Intravenous  Once 03/11/21 1733 03/11/21 1907              Family Communication/Anticipated D/C date and plan/Code Status   DVT prophylaxis: enoxaparin (LOVENOX) injection 40 mg Start: 03/11/21 2000 SCDs Start: 03/11/21 1939     Code Status: Full Code  Family Communication: Heidi, mother, at the bedside Disposition Plan:    Status is: Inpatient  Remains inpatient appropriate because:IV treatments appropriate due to intensity of illness or inability to take PO  Dispo:  Patient From: Home  Planned Disposition: Home  Medically stable for discharge: No             Subjective:   Interval events noted.  He has no complaints.  His mother was at the bedside.  Objective:    Vitals:   03/16/21 0826 03/16/21 0830 03/16/21 0845 03/16/21 0900  BP: 125/82 115/71 115/72 119/79  Pulse: 78 83 89 82  Resp: Temp:      TempSrc:      SpO2: 95% 96% 97% 97%  Weight:      Height:       No data found.   Intake/Output  Summary (Last 24 hours) at 03/16/2021 1455 Last data filed at 03/16/2021 1300 Gross per 24 hour  Intake 1319.68 ml  Output --  Net 1319.68 ml    Filed Weights   03/11/21 1804 03/11/21 2317  Weight: 89.8 kg 82.5 kg    Exam:  GEN: NAD SKIN: Rash on the face, chest, back and abdomen.  There are multiple scabbed lesions on the bilateral upper extremities. EYES: No pallor or icterus ENT: MMM CV: RRR PULM: CTA B ABD: soft, ND, NT, +BS CNS: AAO x 3, non focal EXT: No edema or tenderness      Data Reviewed:   I have personally reviewed following labs and imaging studies:  Labs: Labs show the following:   Basic Metabolic Panel: Recent Labs  Lab 03/11/21 1545 03/11/21 1746 03/12/21 0436 03/13/21 0444  NA 135  --  136 139  K 3.4*  --  3.7 3.6  CL 99  --  103 102  CO2 21*  --  27 30  GLUCOSE 167*  --  107* 84  BUN 13  --  9 5*  CREATININE 1.06  --  0.94 0.99  CALCIUM 9.6  --  8.7* 8.7*  MG  --  2.2  --  2.2   GFR Estimated Creatinine Clearance: 126.8 mL/min (by C-G formula based on SCr of 0.99 mg/dL). Liver Function Tests: Recent Labs  Lab 03/11/21 1545  AST 40  ALT 52*  ALKPHOS 80  BILITOT 0.9  PROT 7.8  ALBUMIN 4.7   No results for input(s): LIPASE, AMYLASE in the last 168 hours. No results for input(s): AMMONIA in the last 168 hours. Coagulation profile Recent Labs  Lab 03/15/21 1543  INR 0.9    CBC: Recent Labs  Lab 03/11/21 1545 03/12/21 0436  WBC 19.7* 8.5  NEUTROABS 17.0*  --   HGB 16.6 14.0  HCT 44.8 39.8  MCV 84.7 86.0  PLT 337 242   Cardiac Enzymes: No results for input(s): CKTOTAL, CKMB, CKMBINDEX, TROPONINI in the last 168 hours. BNP (last 3 results) No results for input(s): PROBNP in the last 8760 hours. CBG: Recent Labs  Lab 03/15/21 0813 03/15/21 1145 03/15/21 1701 03/15/21 2136 03/16/21 1149  GLUCAP 108* 132* 119* 107* 128*   D-Dimer: No results for input(s): DDIMER in the last 72 hours. Hgb A1c: No results for  input(s): HGBA1C in the last 72 hours.  Lipid Profile: No results for input(s): CHOL, HDL, LDLCALC, TRIG, CHOLHDL, LDLDIRECT in the last 72 hours. Thyroid function studies: No results for input(s): TSH, T4TOTAL, T3FREE, THYROIDAB in the last 72 hours.  Invalid input(s): FREET3  Anemia work up: No results for input(s): VITAMINB12, FOLATE, FERRITIN, TIBC, IRON, RETICCTPCT in the last 72 hours. Sepsis Labs: Recent Labs  Lab 03/11/21 1545 03/11/21 1746 03/12/21 0436  WBC 19.7*  --  8.5  LATICACIDVEN 5.3* 3.6* 0.8    Microbiology Recent Results (from the past 240 hour(s))  Monkeypox Virus DNA, Qualitative Real-Time PCR     Status: None   Collection Time: 03/11/21  5:46 PM   Specimen: Sterile Swab  Result Value Ref Range Status   Orthopoxvirus DNA, QL PCR NOT DETECTED NOT DETECTED Final    Comment: (NOTE) Non-variola Orthopoxvirus DNA not detected by real-time PCR. Performed At: [facility] [address] [place], [place] [id number] Jolene Schimke MD NW:[id number]   Blood culture (routine x 2)     Status: Abnormal   Collection Time: 03/11/21  5:46 PM   Specimen: BLOOD  Result Value Ref Range Status   Specimen Description   Final    BLOOD LEFT ANTECUBITAL Performed at [facility],  [address], [place], [place] [address]    Special Requests   Final    BOTTLES DRAWN AEROBIC AND ANAEROBIC Blood Culture adequate volume Performed at [facility], [address] [place], [place] [address]    Culture  Setup Time   Final    GRAM POSITIVE COCCI IN BOTH AEROBIC AND ANAEROBIC BOTTLES Organism ID to follow CRITICAL RESULT CALLED TO, READ BACK BY AND VERIFIED WITH: Algie Coffer, PHARMD AT 0929 ON 03/12/21 BY GM Performed at [facility], [address] [place], [place] [address]    Culture STAPHYLOCOCCUS AUREUS (A)  Final   Report Status 03/14/2021 FINAL  Final   Organism ID, Bacteria STAPHYLOCOCCUS AUREUS  Final      Susceptibility    Staphylococcus aureus - MIC*    CIPROFLOXACIN <=0.5 SENSITIVE Sensitive     ERYTHROMYCIN <=0.25 SENSITIVE Sensitive     GENTAMICIN <=0.5 SENSITIVE Sensitive     OXACILLIN 0.5 SENSITIVE Sensitive     TETRACYCLINE <=1 SENSITIVE Sensitive     VANCOMYCIN 1 SENSITIVE Sensitive     TRIMETH/SULFA <=10 SENSITIVE Sensitive     CLINDAMYCIN <=0.25 SENSITIVE Sensitive     RIFAMPIN <=0.5 SENSITIVE Sensitive     Inducible Clindamycin NEGATIVE Sensitive     * STAPHYLOCOCCUS AUREUS  Blood culture (routine x 2)     Status: Abnormal   Collection Time: 03/11/21  5:46 PM   Specimen: BLOOD  Result Value Ref Range Status   Specimen Description   Final    BLOOD RIGHT ANTECUBITAL Performed at [facility], [address] [place], [place] [address]    Special Requests   Final    BOTTLES DRAWN AEROBIC AND ANAEROBIC Blood Culture adequate volume Performed at [facility], [address] [place], [place] [address]    Culture  Setup Time   Final    GRAM POSITIVE COCCI ANAEROBIC BOTTLE ONLY CRITICAL RESULT CALLED TO, READ BACK BY AND VERIFIED WITH: CAROLINE CHILDS 03/14/21 1343 AMK Performed at [facility], [address] [place], [place] [address]    Culture STAPHYLOCOCCUS AUREUS (A)  Final   Report Status 03/16/2021 FINAL  Final   Organism ID, Bacteria STAPHYLOCOCCUS AUREUS  Final      Susceptibility   Staphylococcus aureus - MIC*    CIPROFLOXACIN <=0.5 SENSITIVE Sensitive     ERYTHROMYCIN <=0.25 SENSITIVE Sensitive     GENTAMICIN <=0.5 SENSITIVE Sensitive     OXACILLIN 0.5 SENSITIVE Sensitive     TETRACYCLINE <=1 SENSITIVE Sensitive     VANCOMYCIN 1 SENSITIVE Sensitive     TRIMETH/SULFA <=10 SENSITIVE Sensitive     CLINDAMYCIN <=0.25 SENSITIVE Sensitive     RIFAMPIN <=0.5 SENSITIVE Sensitive     Inducible Clindamycin NEGATIVE Sensitive     * STAPHYLOCOCCUS AUREUS  Aerobic Culture w Gram Stain (superficial specimen)     Status: None   Collection Time: 03/11/21   5:46 PM   Specimen: Arm  Result Value Ref Range Status   Specimen Description   Final    ARM Performed at [facility], [address], [place], [place] [address]    Special Requests   Final    NONE Performed at [facility], [address] [place], [place] [address]    Gram Stain NO WBC SEEN NO ORGANISMS SEEN   Final   Culture   Final    ABUNDANT STAPHYLOCOCCUS AUREUS ABUNDANT STREPTOCOCCUS GROUP C Beta hemolytic streptococci are predictably susceptible to penicillin and other beta lactams.  Susceptibility testing not routinely performed. Performed at [facility], [address] [place], [place] [address]    Report Status 03/14/2021 FINAL  Final   Organism ID, Bacteria STAPHYLOCOCCUS AUREUS  Final      Susceptibility   Staphylococcus aureus - MIC*    CIPROFLOXACIN <=0.5 SENSITIVE Sensitive     ERYTHROMYCIN <=0.25 SENSITIVE Sensitive     GENTAMICIN <=0.5 SENSITIVE Sensitive     OXACILLIN 0.5 SENSITIVE Sensitive     TETRACYCLINE <=1 SENSITIVE Sensitive     VANCOMYCIN <=0.5 SENSITIVE Sensitive     TRIMETH/SULFA <=10 SENSITIVE Sensitive     CLINDAMYCIN <=0.25 SENSITIVE Sensitive     RIFAMPIN <=0.5 SENSITIVE Sensitive     Inducible Clindamycin NEGATIVE Sensitive     * ABUNDANT STAPHYLOCOCCUS AUREUS  Blood Culture ID Panel (Reflexed)     Status: Abnormal   Collection Time: 03/11/21  5:46 PM  Result Value Ref Range Status   Enterococcus faecalis NOT DETECTED NOT DETECTED Final   Enterococcus Faecium NOT DETECTED NOT DETECTED Final   Listeria monocytogenes NOT DETECTED NOT DETECTED Final   Staphylococcus species DETECTED (A) NOT DETECTED Final    Comment: CRITICAL RESULT CALLED TO, READ BACK BY AND VERIFIED WITH: Algie Coffer, PHARMD AT 0929 ON 03/12/21 BY GM    Staphylococcus aureus (BCID) DETECTED (A) NOT DETECTED Final    Comment: CRITICAL RESULT CALLED TO, READ BACK BY AND VERIFIED WITH: Algie Coffer, PHARMD AT 0929 ON 03/12/21 BY GM     Staphylococcus epidermidis NOT DETECTED NOT DETECTED Final   Staphylococcus lugdunensis NOT DETECTED NOT DETECTED Final   Streptococcus species NOT DETECTED NOT DETECTED Final   Streptococcus agalactiae NOT DETECTED NOT DETECTED Final   Streptococcus pneumoniae NOT DETECTED NOT DETECTED Final   Streptococcus pyogenes NOT DETECTED NOT DETECTED Final   A.calcoaceticus-baumannii NOT DETECTED NOT DETECTED Final   Bacteroides fragilis NOT DETECTED NOT DETECTED Final   Enterobacterales NOT DETECTED NOT DETECTED Final   Enterobacter cloacae complex NOT DETECTED NOT DETECTED Final   Escherichia coli NOT DETECTED NOT DETECTED Final   Klebsiella aerogenes NOT DETECTED NOT DETECTED Final   Klebsiella oxytoca NOT DETECTED NOT DETECTED Final   Klebsiella pneumoniae NOT DETECTED NOT DETECTED Final   Proteus species NOT DETECTED NOT DETECTED Final   Salmonella species NOT DETECTED NOT DETECTED Final   Serratia marcescens NOT DETECTED NOT DETECTED Final   Haemophilus influenzae NOT DETECTED NOT DETECTED Final   Neisseria meningitidis NOT DETECTED NOT DETECTED Final   Pseudomonas aeruginosa NOT DETECTED NOT DETECTED Final   Stenotrophomonas maltophilia NOT DETECTED NOT DETECTED Final   Candida albicans NOT DETECTED NOT DETECTED Final   Candida auris NOT DETECTED NOT DETECTED Final   Candida glabrata NOT DETECTED NOT DETECTED Final   Candida krusei NOT DETECTED NOT DETECTED Final   Candida parapsilosis NOT DETECTED NOT DETECTED Final   Candida tropicalis NOT DETECTED NOT DETECTED Final   Cryptococcus neoformans/gattii NOT DETECTED NOT DETECTED Final   Meth resistant mecA/C and MREJ NOT DETECTED NOT DETECTED Final    Comment: Performed at [facility], [address] [place], [place] [address]  MRSA Next Gen by PCR, Nasal     Status: None   Collection Time: 03/12/21  1:03 AM   Specimen: Nasal Mucosa; Nasal Swab  Result Value Ref Range Status   MRSA by PCR Next Gen NOT DETECTED NOT DETECTED  Final    Comment: (NOTE) The GeneXpert MRSA Assay (FDA approved for NASAL specimens only), is one component  of a comprehensive MRSA colonization surveillance program. It is not intended to diagnose MRSA infection nor to guide or monitor treatment for MRSA infections. Test performance is not FDA approved in patients less than 70 years old. Performed at [facility], [address] [place], [place] [address]   Chlamydia/NGC rt PCR [facility] only)     Status: None   Collection Time: 03/12/21  1:55 AM   Specimen: Urine  Result Value Ref Range Status   Specimen source GC/Chlam URINE, RANDOM  Final   Chlamydia Tr NOT DETECTED NOT DETECTED Final   N gonorrhoeae NOT DETECTED NOT DETECTED Final    Comment: (NOTE) This CT/NG assay has not been evaluated in patients with a history of  hysterectomy. Performed at [facility], [address] [place], [place] [address]   CULTURE, BLOOD (ROUTINE X 2) w Reflex to ID Panel     Status: None (Preliminary result)   Collection Time: 03/13/21 12:11 AM   Specimen: BLOOD  Result Value Ref Range Status   Specimen Description BLOOD BLOOD LEFT FOREARM  Final   Special Requests   Final    BOTTLES DRAWN AEROBIC AND ANAEROBIC Blood Culture adequate volume   Culture   Final    NO GROWTH 3 DAYS Performed at [facility], [address] [place], [place] [address]    Report Status PENDING  Incomplete  CULTURE, BLOOD (ROUTINE X 2) w Reflex to ID Panel     Status: None (Preliminary result)   Collection Time: 03/13/21 12:11 AM   Specimen: BLOOD  Result Value Ref Range Status   Specimen Description BLOOD BLOOD LEFT HAND  Final   Special Requests   Final    BOTTLES DRAWN AEROBIC AND ANAEROBIC Blood Culture adequate volume   Culture   Final    NO GROWTH 3 DAYS Performed at [facility], [address] [place], [place] [address]    Report Status PENDING  Incomplete    Procedures and diagnostic  studies:  ECHOCARDIOGRAM COMPLETE  Result Date: 03/14/2021    ECHOCARDIOGRAM REPORT   Patient Name:   Randy Henry Date of Exam: 03/14/2021 Medical Rec #:  [id number]     Height:       71.0 in Accession #:    [id number]    Weight:       181.9 lb Date of Birth:  11-Sep-2000     BSA:          2.025 m Patient Age:    20 years      BP:           140/73 mmHg Patient Gender: M             HR:           73 bpm. Exam Location:  [facility] Procedure: 2D Echo, Cardiac Doppler, Color Doppler and Strain Analysis Indications:     Bacteremia R78.81  History:         Patient has no prior history of Echocardiogram examinations.  Sonographer:     Neysa Bonito Roar Referring Phys:  [id number] Lurene Shadow Diagnosing Phys: Julien Nordmann MD IMPRESSIONS  1. No valve endocarditis noted  2. Left ventricular ejection fraction, by estimation, is 60 to 65%. The left ventricle has normal function. The left ventricle has no regional wall motion abnormalities. Left ventricular diastolic parameters were normal. The average left ventricular global longitudinal strain is -19.5 %. The global longitudinal strain is normal.  3. Right ventricular systolic function is  normal. The right ventricular size is normal.  4. The mitral valve is normal in structure. No evidence of mitral valve regurgitation. No evidence of mitral stenosis.  5. The aortic valve is normal in structure. Aortic valve regurgitation is not visualized. No aortic stenosis is present.  6. The inferior vena cava is normal in size with greater than 50% respiratory variability, suggesting right atrial pressure of 3 mmHg. FINDINGS  Left Ventricle: Left ventricular ejection fraction, by estimation, is 60 to 65%. The left ventricle has normal function. The left ventricle has no regional wall motion abnormalities. The average left ventricular global longitudinal strain is -19.5 %. The global longitudinal strain is normal. The left ventricular internal cavity size was normal in size. There is no left  ventricular hypertrophy. Left ventricular diastolic parameters were normal. Right Ventricle: The right ventricular size is normal. No increase in right ventricular wall thickness. Right ventricular systolic function is normal. Left Atrium: Left atrial size was normal in size. Right Atrium: Right atrial size was normal in size. Pericardium: There is no evidence of pericardial effusion. Mitral Valve: The mitral valve is normal in structure. No evidence of mitral valve regurgitation. No evidence of mitral valve stenosis. Tricuspid Valve: The tricuspid valve is normal in structure. Tricuspid valve regurgitation is not demonstrated. No evidence of tricuspid stenosis. Aortic Valve: The aortic valve is normal in structure. Aortic valve regurgitation is not visualized. No aortic stenosis is present. Aortic valve peak gradient measures 6.4 mmHg. Pulmonic Valve: The pulmonic valve was normal in structure. Pulmonic valve regurgitation is not visualized. No evidence of pulmonic stenosis. Aorta: The aortic root is normal in size and structure. Venous: The inferior vena cava is normal in size with greater than 50% respiratory variability, suggesting right atrial pressure of 3 mmHg. IAS/Shunts: No atrial level shunt detected by color flow Doppler.  LEFT VENTRICLE PLAX 2D LVIDd:         4.60 cm  Diastology LVIDs:         3.40 cm  LV e' medial:    10.80 cm/s LV PW:         1.00 cm  LV E/e' medial:  7.0 LV IVS:        1.00 cm  LV e' lateral:   11.50 cm/s LVOT diam:     2.50 cm  LV E/e' lateral: 6.6 LVOT Area:     4.91 cm                         2D Longitudinal Strain                         2D Strain GLS Avg:     -19.5 % RIGHT VENTRICLE RV Basal diam:  3.30 cm RV Mid diam:    2.80 cm RV S prime:     14.00 cm/s TAPSE (M-mode): 2.3 cm LEFT ATRIUM             Index       RIGHT ATRIUM           Index LA diam:        3.30 cm 1.63 cm/m  RA Area:     13.50 cm LA Vol (A2C):   41.5 ml 20.49 ml/m RA Volume:   32.00 ml  15.80 ml/m LA Vol  (A4C):   30.8 ml 15.21 ml/m LA Biplane Vol: 38.8 ml 19.16 ml/m  AORTIC VALVE  PULMONIC VALVE AV Area (Vmax): 4.44 cm    PV Vmax:        0.87 m/s AV Vmax:        126.00 cm/s PV Peak grad:   3.0 mmHg AV Peak Grad:   6.4 mmHg    RVOT Peak grad: 3 mmHg LVOT Vmax:      114.00 cm/s  AORTA Ao Root diam: 2.80 cm MITRAL VALVE MV Area (PHT): 3.30 cm    SHUNTS MV Decel Time: 230 msec    Systemic Diam: 2.50 cm MV E velocity: 75.40 cm/s MV A velocity: 38.10 cm/s MV E/A ratio:  1.98 MV A Prime:    8.3 cm/s Julien Nordmann MD Electronically signed by Julien Nordmann MD Signature Date/Time: 03/14/2021/7:07:33 PM    Final    ECHO TEE  Result Date: 03/16/2021    TRANSESOPHOGEAL ECHO REPORT   Patient Name:   Randy Henry Date of Exam: 03/16/2021 Medical Rec #:  [id number]     Height:       71.0 in Accession #:    [id number]    Weight:       181.9 lb Date of Birth:  22-Mar-2001     BSA:          2.025 m Patient Age:    20 years      BP:           115/71 mmHg Patient Gender: M             HR:           83 bpm. Exam Location:  [facility] Procedure: Transesophageal Echo, Cardiac Doppler and Color Doppler Indications:     Bacteremia R78.81  History:         Patient has prior history of Echocardiogram examinations, most                  recent 03/14/2021. ADHD, SVT.  Sonographer:     Cristela Blue Referring Phys:  [id number] Diagnosing Phys: Yvonne Kendall MD PROCEDURE: After discussion of the risks and benefits of a TEE, an informed consent was obtained from the patient. TEE procedure time was 25 minutes. The transesophogeal probe was passed without difficulty through the esophogus of the patient. Local oropharyngeal anesthetic was provided with viscous lidocaine. Sedation performed by performing physician. Patients was under conscious sedation during this procedure. Anesthetic administered: of Fentanyl, 5.0mg  of Versed. Image quality was good. The patient's vital signs; including heart rate, blood pressure, and  oxygen saturation; remained stable throughout the procedure. The patient developed no complications during the procedure. IMPRESSIONS  1. Left ventricular ejection fraction, by estimation, is >55%. The left ventricle has normal function.  2. Right ventricular systolic function is normal. The right ventricular size is normal.  3. No left atrial/left atrial appendage thrombus was detected.  4. The mitral valve is normal in structure. Trivial mitral valve regurgitation.  5. There is a tiny mobile echodensity adjacent to the right coronary leaflet in the LVOT suspicious for a vegetation in the setting of MSSA bacteremia, though a Lambl's excrescence cannot be excluded. The aortic valve is tricuspid. Aortic valve regurgitation is trivial. FINDINGS  Left Ventricle: Left ventricular ejection fraction, by estimation, is >55%. The left ventricle has normal function. Right Ventricle: The right ventricular size is normal. Right ventricular systolic function is normal. Left Atrium: No left atrial/left atrial appendage thrombus was detected. Pericardium: There is no evidence of pericardial effusion. Mitral Valve: The mitral valve is normal  in structure. Trivial mitral valve regurgitation. Tricuspid Valve: The tricuspid valve is normal in structure. Tricuspid valve regurgitation is mild. Aortic Valve: There is a tiny mobile echodensity adjacent to the right coronary leaflet in the LVOT suspicious for a vegetation in the setting of MSSA bacteremia, though a Lambl's excrescence cannot be excluded. The aortic valve is tricuspid. Aortic valve regurgitation is trivial. A mobile vegetation is seen. Pulmonic Valve: The pulmonic valve was normal in structure. Pulmonic valve regurgitation is trivial. Aorta: The aortic root and ascending aorta are structurally normal, with no evidence of dilitation. IAS/Shunts: No atrial level shunt detected by color flow Doppler. Yvonne Kendall MD Electronically signed by Yvonne Kendall MD Signature  Date/Time: 03/16/2021/1:01:32 PM    Final                LOS: 5 days   Draven Natter  [facility]   Pager on [email or web address]. If 7PM-7AM, please contact night-coverage at [email or web address]     03/16/2021, 2:55 PM

## 2021-03-17 ENCOUNTER — Encounter: Payer: Self-pay | Admitting: Internal Medicine

## 2021-03-17 DIAGNOSIS — R7881 Bacteremia: Secondary | ICD-10-CM | POA: Diagnosis not present

## 2021-03-17 DIAGNOSIS — B9561 Methicillin susceptible Staphylococcus aureus infection as the cause of diseases classified elsewhere: Secondary | ICD-10-CM | POA: Diagnosis not present

## 2021-03-17 DIAGNOSIS — A4101 Sepsis due to Methicillin susceptible Staphylococcus aureus: Secondary | ICD-10-CM | POA: Diagnosis not present

## 2021-03-17 LAB — GLUCOSE, CAPILLARY
Glucose-Capillary: 89 mg/dL (ref 70–99)
Glucose-Capillary: 98 mg/dL (ref 70–99)
Glucose-Capillary: 136 mg/dL — ABNORMAL HIGH (ref 70–99)

## 2021-03-17 NOTE — Plan of Care (Signed)
  Problem: Clinical Measurements: Goal: Ability to maintain clinical measurements within normal limits will improve Outcome: Progressing Goal: Will remain free from infection Outcome: Progressing Goal: Diagnostic test results will improve Outcome: Progressing Goal: Respiratory complications will improve Outcome: Progressing Goal: Cardiovascular complication will be avoided Outcome: Progressing   Problem: Pain Managment: Goal: General experience of comfort will improve Outcome: Progressing   Pt is involved in and agrees with the plan of care. V/S stable. Received a call from CCMD that pt HR is in 130s. Pt in bed; reports being awoken and feeling hot. Rechecked his HR & went down to 98 and Bp-142/75. Denies any pain or SOB.

## 2021-03-17 NOTE — Progress Notes (Signed)
Nutrition Follow Up Note   DOCUMENTATION CODES:   Not applicable  INTERVENTION:   Ensure Enlive po TID, each supplement provides 350 kcal and 20 grams of protein  MVI po daily   NUTRITION DIAGNOSIS:   Inadequate oral intake related to acute illness as evidenced by per patient/family report.  GOAL:   Patient will meet greater than or equal to 90% of their needs -met   MONITOR:   PO intake, Supplement acceptance, Labs, Weight trends, Skin, I & O's  ASSESSMENT:   20 y.o. male with medical history significant for eczema, ADHD, substance abuse and asthma who is admitted with sepsis secondary to skin lesions and erythema affecting his upper extremities bilaterally. Pt also developed SVT in hospital.  TEE done on 03/16/2021 showed findings suspicious for vegetation on the aortic valve.   Pt with good appetite and oral intake; pt eating 100% of meals and is drinking his Ensure supplements. No new weight since admit; will request weekly weights.   Medications reviewed and include: lovenox, cefazolin  Labs reviewed: K 3.6 wnl, BUN 5(L), Mg 2.2 wnl Cbgs- 89, 98 x 24 hrs  Diet Order:    Diet Order             Diet regular Room service appropriate? Yes; Fluid consistency: Thin  Diet effective now                  EDUCATION NEEDS:   Education needs have been addressed  Skin:  Skin Assessment: Reviewed RN Assessment  Last BM:  9/18- type 2  Height:   Ht Readings from Last 1 Encounters:  03/11/21 _0  (1.803 m)    Weight:   Wt Readings from Last 1 Encounters:  03/11/21 82.5 kg    Ideal Body Weight:  78.18 kg  BMI:  Body mass index is 25.37 kg/m.  Estimated Nutritional Needs:   Kcal:  2300-2600kcal/day  Protein:  >115g/day  Fluid:  2.4-2.7L/day  Koleen Distance MS, RD, LDN Please refer to [facility] for RD and/or RD on-call/weekend/after hours pager

## 2021-03-17 NOTE — Progress Notes (Signed)
ID Stable No complaints No fever Itching better  O/E awake and alert Patient Vitals for the past 24 hrs:  BP Temp Temp src Pulse Resp SpO2  03/17/21 0756 135/76 98.3 F (36.8 C) Oral 68 16 98 %  03/17/21 0408 129/78 98 F (36.7 C) Oral 69 18 100 %  03/16/21 2250 (!) 142/75 -- -- 98 20 98 %  03/16/21 2001 (!) 160/72 98.4 F (36.9 C) Oral 83 20 99 %  03/16/21 1650 137/77 98 F (36.7 C) Oral 94 18 99 %   Papular eruption face and chest Eschar wounds over arms Chest CTA Hss1s2 Abd soft CNS non focal  labs  CBC Latest Ref Rng & Units 03/12/2021 03/11/2021 04/08/2018  WBC 4.0 - 10.5 K/uL 8.5 19.7(H) 7.7  Hemoglobin 13.0 - 17.0 g/dL 37.6 28.3 16.5(H)  Hematocrit 39.0 - 52.0 % 39.8 44.8 45.6  Platelets 150 - 400 K/uL 242 337 290     CMP Latest Ref Rng & Units 03/13/2021 03/12/2021 03/11/2021  Glucose 70 - 99 mg/dL 84 151(V) 616(W)  BUN 6 - 20 mg/dL 5(L) 9 13  Creatinine 7.37 - 1.24 mg/dL 1.06 2.69 4.85  Sodium 135 - 145 mmol/L 139 136 135  Potassium 3.5 - 5.1 mmol/L 3.6 3.7 3.4(L)  Chloride 98 - 111 mmol/L 102 103 99  CO2 22 - 32 mmol/L 30 27 21(L)  Calcium 8.9 - 10.3 mg/dL 4.6(E) 7.0(J) 9.6  Total Protein 6.5 - 8.1 g/dL - - 7.8  Total Bilirubin 0.3 - 1.2 mg/dL - - 0.9  Alkaline Phos 38 - 126 U/L - - 80  AST 15 - 41 U/L - - 40  ALT 0 - 44 U/L - - 52(H)     Impression/recommendation MSSa bacteremia MSSA wounds on the skin  Atopic dermatitis IVDA TEE suggestive of a tiny mobile echodensity adjacent to the right coronary leaflet in the LVOT suspicious for a vegetation in the setting of MSSA bacteremia, though a Lambl's excrescence cannot be excluded.   Pt is on Iv cefazolin- will need for 6 weeks -cannot be discharged home on PICC- line  Discussed the management with the patient

## 2021-03-17 NOTE — Progress Notes (Signed)
PROGRESS NOTE    Randy Henry  MWU:[id number] DOB: 22-Sep-2000 DOA: 03/11/2021 PCP: [name], MD     Brief Narrative:  Randy Henry is a 20 y.o. male with medical history significant for COVID-19 infection in January 2022, atopic eczema, ADHD, substance use disorder, asthma, who presented to the hospital because of multiple skin lesions on his upper extremities.   He was found to have severe sepsis secondary to MSSA bacteremia.  Left upper extremity cellulitis was suspected as well.  Blood culture revealed MSSA.  TEE was concerning for aortic valve vegetation.  ID recommends IV cefazolin for a total of 6 weeks.     He developed SVT during his stay in the hospital so he was started on oral Cardizem.  New events last 24 hours / Subjective: No new complaints on exam, denies CP, N/V, SOB.   Assessment & Plan:   Principal Problem:   Sepsis (HCC) Active Problems:   Bacteremia due to Staphylococcus aureus   SVT (supraventricular tachycardia) (HCC)   Severe sepsis secondary to MSSA bacteremia and ?left upper extremity cellulitis, suspected aortic valve endocarditis -TEE done on 03/16/2021 showed findings suspicious for vegetation on the aortic valve. -Continue IV cefazolin.  ID recommends total of 6 weeks of antibiotics. 9/15-10/26. Patient to remain inpatient for IV antibiotics due to hx of drug abuse   Atopic eczema, multiple scabbed lesions/rash on bilateral upper extremities, face, chest and back -Monkey pox viral DNA test was negative   SVT -Cardizem for rate control  ADHD -His mother said he does not take Evekeo at home and that he is medically nonadherent.   Substance use disorder -Patient said he uses cocaine and CBD Gummies.  He said he last used cocaine about a week prior to admission.  He said he does not use methadone but his cocaine may have been laced with methadone.  He said he does not use any IV drugs.  Mother said patient had "snorted" prednisone and  amphetamines on 03/10/2021.  Reportedly, the prednisone was prescribed for the new lesions that he had developed on his upper extremities.   DVT prophylaxis:  enoxaparin (LOVENOX) injection 40 mg Start: 03/11/21 2000 SCDs Start: 03/11/21 1939  Code Status:     Code Status Orders  (From admission, onward)           Start     Ordered   03/11/21 1939  Full code  Continuous        03/11/21 1938           Code Status History     This patient has a current code status but no historical code status.      Family Communication: None at bedside Disposition Plan:  Status is: Inpatient  Remains inpatient appropriate because:Inpatient level of care appropriate due to severity of illness  Dispo:  Patient From: Home  Planned Disposition: Home  Medically stable for discharge: No      Antimicrobials:  Anti-infectives (From admission, onward)    Start     Dose/Rate Route Frequency Ordered Stop   03/16/21 1830  ceFAZolin (ANCEF) IVPB 2g/100 mL premix        2 g 200 mL/hr over 30 Minutes Intravenous Every 8 hours 03/16/21 1254     03/15/21 1045  valACYclovir (VALTREX) tablet 500 mg        500 mg Oral 2 times daily 03/15/21 0945     03/12/21 1200  vancomycin (VANCOREADY) IVPB 1750 mg/350 mL  Status:  Discontinued        1,750 mg 175 mL/hr over 120 Minutes Intravenous Every 12 hours 03/12/21 0742 03/12/21 1006   03/12/21 1100  vancomycin (VANCOREADY) IVPB 1500 mg/300 mL  Status:  Discontinued        1,500 mg 150 mL/hr over 120 Minutes Intravenous Every 12 hours 03/11/21 2150 03/12/21 0742   03/12/21 1100  ceFAZolin (ANCEF) IVPB 2g/100 mL premix  Status:  Discontinued        2 g 200 mL/hr over 30 Minutes Intravenous Every 8 hours 03/12/21 1006 03/16/21 1254   03/12/21 0000  ceFEPIme (MAXIPIME) 2 g in sodium chloride 0.9 % 100 mL IVPB  Status:  Discontinued        2 g 200 mL/hr over 30 Minutes Intravenous Every 8 hours 03/11/21 2037 03/12/21 1006   03/11/21 2200  vancomycin  (VANCOCIN) IVPB 1000 mg/200 mL premix        1,000 mg 200 mL/hr over 60 Minutes Intravenous  Once 03/11/21 2037 03/12/21 0154   03/11/21 1745  ceFEPIme (MAXIPIME) 2 g in sodium chloride 0.9 % 100 mL IVPB        2 g 200 mL/hr over 30 Minutes Intravenous  Once 03/11/21 1733 03/11/21 1831   03/11/21 1745  metroNIDAZOLE (FLAGYL) IVPB 500 mg        500 mg 100 mL/hr over 60 Minutes Intravenous  Once 03/11/21 1733 03/11/21 1942   03/11/21 1745  vancomycin (VANCOCIN) IVPB 1000 mg/200 mL premix        1,000 mg 200 mL/hr over 60 Minutes Intravenous  Once 03/11/21 1733 03/11/21 1907        Objective: Vitals:   03/16/21 2001 03/16/21 2250 03/17/21 0408 03/17/21 0756  BP: (!) 160/72 (!) 142/75 129/78 135/76  Pulse: 83 98 69 68  Resp: Temp: 98.4 F (36.9 C)  98 F (36.7 C) 98.3 F (36.8 C)  TempSrc: Oral  Oral Oral  SpO2: 99% 98% 100% 98%  Weight:      Height:        Intake/Output Summary (Last 24 hours) at 03/17/2021 1254 Last data filed at 03/17/2021 1050 Gross per 24 hour  Intake 1223.87 ml  Output --  Net 1223.87 ml   Filed Weights   03/11/21 1804 03/11/21 2317  Weight: 89.8 kg 82.5 kg    Examination:  General exam: Appears calm and comfortable  Respiratory system: Clear to auscultation. Respiratory effort normal. No respiratory distress. No conversational dyspnea.  Cardiovascular system: S1 & S2 heard, RRR. No murmurs. No pedal edema. Gastrointestinal system: Abdomen is nondistended, soft and nontender. Normal bowel sounds heard. Central nervous system: Alert and oriented. No focal neurological deficits. Speech clear.  Extremities: Symmetric in appearance  Skin: +multiple excoriation chest, face, upper extremities  Psychiatry: Judgement and insight appear normal. Mood & affect appropriate.   Data Reviewed: I have personally reviewed following labs and imaging studies  CBC: Recent Labs  Lab 03/11/21 1545 03/12/21 0436  WBC 19.7* 8.5  NEUTROABS 17.0*  --    HGB 16.6 14.0  HCT 44.8 39.8  MCV 84.7 86.0  PLT 337 242   Basic Metabolic Panel: Recent Labs  Lab 03/11/21 1545 03/11/21 1746 03/12/21 0436 03/13/21 0444  NA 135  --  136 139  K 3.4*  --  3.7 3.6  CL 99  --  103 102  CO2 21*  --  27 30  GLUCOSE 167*  --  107* 84  BUN 13  --  9 5*  CREATININE 1.06  --  0.94 0.99  CALCIUM 9.6  --  8.7* 8.7*  MG  --  2.2  --  2.2   GFR: Estimated Creatinine Clearance: 126.8 mL/min (by C-G formula based on SCr of 0.99 mg/dL). Liver Function Tests: Recent Labs  Lab 03/11/21 1545  AST 40  ALT 52*  ALKPHOS 80  BILITOT 0.9  PROT 7.8  ALBUMIN 4.7   No results for input(s): LIPASE, AMYLASE in the last 168 hours. No results for input(s): AMMONIA in the last 168 hours. Coagulation Profile: Recent Labs  Lab 03/15/21 1543  INR 0.9   Cardiac Enzymes: No results for input(s): CKTOTAL, CKMB, CKMBINDEX, TROPONINI in the last 168 hours. BNP (last 3 results) No results for input(s): PROBNP in the last 8760 hours. HbA1C: No results for input(s): HGBA1C in the last 72 hours. CBG: Recent Labs  Lab 03/16/21 1149 03/16/21 1718 03/16/21 2035 03/17/21 0753 03/17/21 1215  GLUCAP 128* 99 139* 89 98   Lipid Profile: No results for input(s): CHOL, HDL, LDLCALC, TRIG, CHOLHDL, LDLDIRECT in the last 72 hours. Thyroid Function Tests: No results for input(s): TSH, T4TOTAL, FREET4, T3FREE, THYROIDAB in the last 72 hours. Anemia Panel: No results for input(s): VITAMINB12, FOLATE, FERRITIN, TIBC, IRON, RETICCTPCT in the last 72 hours. Sepsis Labs: Recent Labs  Lab 03/11/21 1545 03/11/21 1746 03/12/21 0436  LATICACIDVEN 5.3* 3.6* 0.8    Recent Results (from the past 240 hour(s))  Monkeypox Virus DNA, Qualitative Real-Time PCR     Status: None   Collection Time: 03/11/21  5:46 PM   Specimen: Sterile Swab  Result Value Ref Range Status   Orthopoxvirus DNA, QL PCR NOT DETECTED NOT DETECTED Final    Comment: (NOTE) Non-variola Orthopoxvirus  DNA not detected by real-time PCR. Performed At: [facility] [address] [place], [place] [id number] Jolene Schimke MD WU:[id number]   Blood culture (routine x 2)     Status: Abnormal   Collection Time: 03/11/21  5:46 PM   Specimen: BLOOD  Result Value Ref Range Status   Specimen Description   Final    BLOOD LEFT ANTECUBITAL Performed at [facility] [address], [place], [place] [address]    Special Requests   Final    BOTTLES DRAWN AEROBIC AND ANAEROBIC Blood Culture adequate volume Performed at [facility], [address] [place], [place] [address]    Culture  Setup Time   Final    GRAM POSITIVE COCCI IN BOTH AEROBIC AND ANAEROBIC BOTTLES Organism ID to follow CRITICAL RESULT CALLED TO, READ BACK BY AND VERIFIED WITH: Algie Coffer, PHARMD AT 0929 ON 03/12/21 BY GM Performed at [facility], [address] [place], [place] [address]    Culture STAPHYLOCOCCUS AUREUS (A)  Final   Report Status 03/14/2021 FINAL  Final   Organism ID, Bacteria STAPHYLOCOCCUS AUREUS  Final      Susceptibility   Staphylococcus aureus - MIC*    CIPROFLOXACIN <=0.5 SENSITIVE Sensitive     ERYTHROMYCIN <=0.25 SENSITIVE Sensitive     GENTAMICIN <=0.5 SENSITIVE Sensitive     OXACILLIN 0.5 SENSITIVE Sensitive     TETRACYCLINE <=1 SENSITIVE Sensitive     VANCOMYCIN 1 SENSITIVE Sensitive     TRIMETH/SULFA <=10 SENSITIVE Sensitive     CLINDAMYCIN <=0.25 SENSITIVE Sensitive     RIFAMPIN <=0.5 SENSITIVE Sensitive     Inducible Clindamycin NEGATIVE Sensitive     * STAPHYLOCOCCUS AUREUS  Blood culture (routine x 2)  Status: Abnormal   Collection Time: 03/11/21  5:46 PM   Specimen: BLOOD  Result Value Ref Range Status   Specimen Description   Final    BLOOD RIGHT ANTECUBITAL Performed at [facility] [address], [place], [place] [address]    Special Requests   Final    BOTTLES DRAWN AEROBIC AND ANAEROBIC Blood Culture adequate  volume Performed at [facility], [address] [place], [place] [address]    Culture  Setup Time   Final    GRAM POSITIVE COCCI ANAEROBIC BOTTLE ONLY CRITICAL RESULT CALLED TO, READ BACK BY AND VERIFIED WITH: CAROLINE CHILDS 03/14/21 1343 AMK Performed at [facility], [address] [place], [place] [address]    Culture STAPHYLOCOCCUS AUREUS (A)  Final   Report Status 03/16/2021 FINAL  Final   Organism ID, Bacteria STAPHYLOCOCCUS AUREUS  Final      Susceptibility   Staphylococcus aureus - MIC*    CIPROFLOXACIN <=0.5 SENSITIVE Sensitive     ERYTHROMYCIN <=0.25 SENSITIVE Sensitive     GENTAMICIN <=0.5 SENSITIVE Sensitive     OXACILLIN 0.5 SENSITIVE Sensitive     TETRACYCLINE <=1 SENSITIVE Sensitive     VANCOMYCIN 1 SENSITIVE Sensitive     TRIMETH/SULFA <=10 SENSITIVE Sensitive     CLINDAMYCIN <=0.25 SENSITIVE Sensitive     RIFAMPIN <=0.5 SENSITIVE Sensitive     Inducible Clindamycin NEGATIVE Sensitive     * STAPHYLOCOCCUS AUREUS  Aerobic Culture w Gram Stain (superficial specimen)     Status: None   Collection Time: 03/11/21  5:46 PM   Specimen: Arm  Result Value Ref Range Status   Specimen Description   Final    ARM Performed at [facility], [address], [place], [place] [address]    Special Requests   Final    NONE Performed at [facility], [address] [place], [place] [address]    Gram Stain NO WBC SEEN NO ORGANISMS SEEN   Final   Culture   Final    ABUNDANT STAPHYLOCOCCUS AUREUS ABUNDANT STREPTOCOCCUS GROUP C Beta hemolytic streptococci are predictably susceptible to penicillin and other beta lactams. Susceptibility testing not routinely performed. Performed at [facility], [address], [place], [place] [address]    Report Status 03/14/2021 FINAL  Final   Organism ID, Bacteria STAPHYLOCOCCUS AUREUS  Final      Susceptibility   Staphylococcus aureus - MIC*    CIPROFLOXACIN <=0.5 SENSITIVE Sensitive      ERYTHROMYCIN <=0.25 SENSITIVE Sensitive     GENTAMICIN <=0.5 SENSITIVE Sensitive     OXACILLIN 0.5 SENSITIVE Sensitive     TETRACYCLINE <=1 SENSITIVE Sensitive     VANCOMYCIN <=0.5 SENSITIVE Sensitive     TRIMETH/SULFA <=10 SENSITIVE Sensitive     CLINDAMYCIN <=0.25 SENSITIVE Sensitive     RIFAMPIN <=0.5 SENSITIVE Sensitive     Inducible Clindamycin NEGATIVE Sensitive     * ABUNDANT STAPHYLOCOCCUS AUREUS  Blood Culture ID Panel (Reflexed)     Status: Abnormal   Collection Time: 03/11/21  5:46 PM  Result Value Ref Range Status   Enterococcus faecalis NOT DETECTED NOT DETECTED Final   Enterococcus Faecium NOT DETECTED NOT DETECTED Final   Listeria monocytogenes NOT DETECTED NOT DETECTED Final   Staphylococcus species DETECTED (A) NOT DETECTED Final    Comment: CRITICAL RESULT CALLED TO, READ BACK BY AND VERIFIED WITH: Algie Coffer, PHARMD AT 0929 ON 03/12/21 BY GM    Staphylococcus aureus (BCID) DETECTED (A) NOT DETECTED  Final    Comment: CRITICAL RESULT CALLED TO, READ BACK BY AND VERIFIED WITH: Algie Coffer, PHARMD AT [phone] ON 03/12/21 BY GM    Staphylococcus epidermidis NOT DETECTED NOT DETECTED Final   Staphylococcus lugdunensis NOT DETECTED NOT DETECTED Final   Streptococcus species NOT DETECTED NOT DETECTED Final   Streptococcus agalactiae NOT DETECTED NOT DETECTED Final   Streptococcus pneumoniae NOT DETECTED NOT DETECTED Final   Streptococcus pyogenes NOT DETECTED NOT DETECTED Final   A.calcoaceticus-baumannii NOT DETECTED NOT DETECTED Final   Bacteroides fragilis NOT DETECTED NOT DETECTED Final   Enterobacterales NOT DETECTED NOT DETECTED Final   Enterobacter cloacae complex NOT DETECTED NOT DETECTED Final   Escherichia coli NOT DETECTED NOT DETECTED Final   Klebsiella aerogenes NOT DETECTED NOT DETECTED Final   Klebsiella oxytoca NOT DETECTED NOT DETECTED Final   Klebsiella pneumoniae NOT DETECTED NOT DETECTED Final   Proteus species NOT DETECTED NOT DETECTED Final    Salmonella species NOT DETECTED NOT DETECTED Final   Serratia marcescens NOT DETECTED NOT DETECTED Final   Haemophilus influenzae NOT DETECTED NOT DETECTED Final   Neisseria meningitidis NOT DETECTED NOT DETECTED Final   Pseudomonas aeruginosa NOT DETECTED NOT DETECTED Final   Stenotrophomonas maltophilia NOT DETECTED NOT DETECTED Final   Candida albicans NOT DETECTED NOT DETECTED Final   Candida auris NOT DETECTED NOT DETECTED Final   Candida glabrata NOT DETECTED NOT DETECTED Final   Candida krusei NOT DETECTED NOT DETECTED Final   Candida parapsilosis NOT DETECTED NOT DETECTED Final   Candida tropicalis NOT DETECTED NOT DETECTED Final   Cryptococcus neoformans/gattii NOT DETECTED NOT DETECTED Final   Meth resistant mecA/C and MREJ NOT DETECTED NOT DETECTED Final    Comment: Performed at [facility], [address] [place], [place] [address]  MRSA Next Gen by PCR, Nasal     Status: None   Collection Time: 03/12/21  1:03 AM   Specimen: Nasal Mucosa; Nasal Swab  Result Value Ref Range Status   MRSA by PCR Next Gen NOT DETECTED NOT DETECTED Final    Comment: (NOTE) The GeneXpert MRSA Assay (FDA approved for NASAL specimens only), is one component of a comprehensive MRSA colonization surveillance program. It is not intended to diagnose MRSA infection nor to guide or monitor treatment for MRSA infections. Test performance is not FDA approved in patients less than 59 years old. Performed at [facility], [address] [place], [place] [address]   Chlamydia/NGC rt PCR [facility] only)     Status: None   Collection Time: 03/12/21  1:55 AM   Specimen: Urine  Result Value Ref Range Status   Specimen source GC/Chlam URINE, RANDOM  Final   Chlamydia Tr NOT DETECTED NOT DETECTED Final   N gonorrhoeae NOT DETECTED NOT DETECTED Final    Comment: (NOTE) This CT/NG assay has not been evaluated in patients with a history of  hysterectomy. Performed at [facility],  [address] [place], [place] [address]   CULTURE, BLOOD (ROUTINE X 2) w Reflex to ID Panel     Status: None (Preliminary result)   Collection Time: 03/13/21 12:11 AM   Specimen: BLOOD  Result Value Ref Range Status   Specimen Description BLOOD BLOOD LEFT FOREARM  Final   Special Requests   Final    BOTTLES DRAWN AEROBIC AND ANAEROBIC Blood Culture adequate volume   Culture   Final    NO GROWTH 4 DAYS Performed at [facility], [address], [place], [place] [address]  Report Status PENDING  Incomplete  CULTURE, BLOOD (ROUTINE X 2) w Reflex to ID Panel     Status: None (Preliminary result)   Collection Time: 03/13/21 12:11 AM   Specimen: BLOOD  Result Value Ref Range Status   Specimen Description BLOOD BLOOD LEFT HAND  Final   Special Requests   Final    BOTTLES DRAWN AEROBIC AND ANAEROBIC Blood Culture adequate volume   Culture   Final    NO GROWTH 4 DAYS Performed at [facility], [address], [place], [place] [address]    Report Status PENDING  Incomplete      Radiology Studies: ECHO TEE  Result Date: 03/16/2021    TRANSESOPHOGEAL ECHO REPORT   Patient Name:   BERTIN INABINET Date of Exam: 03/16/2021 Medical Rec #:  [id number]     Height:       71.0 in Accession #:    [id number]    Weight:       181.9 lb Date of Birth:  2001-01-07     BSA:          2.025 m Patient Age:    20 years      BP:           115/71 mmHg Patient Gender: M             HR:           83 bpm. Exam Location:  [facility] Procedure: Transesophageal Echo, Cardiac Doppler and Color Doppler Indications:     Bacteremia R78.81  History:         Patient has prior history of Echocardiogram examinations, most                  recent 03/14/2021. ADHD, SVT.  Sonographer:     Cristela Blue Referring Phys:  [id number] Diagnosing Phys: Yvonne Kendall MD PROCEDURE: After discussion of the risks and benefits of a TEE, an informed consent was obtained from the patient. TEE procedure time was 25  minutes. The transesophogeal probe was passed without difficulty through the esophogus of the patient. Local oropharyngeal anesthetic was provided with viscous lidocaine. Sedation performed by performing physician. Patients was under conscious sedation during this procedure. Anesthetic administered: of Fentanyl, 5.0mg  of Versed. Image quality was good. The patient's vital signs; including heart rate, blood pressure, and oxygen saturation; remained stable throughout the procedure. The patient developed no complications during the procedure. IMPRESSIONS  1. Left ventricular ejection fraction, by estimation, is >55%. The left ventricle has normal function.  2. Right ventricular systolic function is normal. The right ventricular size is normal.  3. No left atrial/left atrial appendage thrombus was detected.  4. The mitral valve is normal in structure. Trivial mitral valve regurgitation.  5. There is a tiny mobile echodensity adjacent to the right coronary leaflet in the LVOT suspicious for a vegetation in the setting of MSSA bacteremia, though a Lambl's excrescence cannot be excluded. The aortic valve is tricuspid. Aortic valve regurgitation is trivial. FINDINGS  Left Ventricle: Left ventricular ejection fraction, by estimation, is >55%. The left ventricle has normal function. Right Ventricle: The right ventricular size is normal. Right ventricular systolic function is normal. Left Atrium: No left atrial/left atrial appendage thrombus was detected. Pericardium: There is no evidence of pericardial effusion. Mitral Valve: The mitral valve is normal in structure. Trivial mitral valve regurgitation. Tricuspid Valve: The tricuspid valve is normal in structure. Tricuspid valve regurgitation is mild. Aortic Valve: There is a tiny  mobile echodensity adjacent to the right coronary leaflet in the LVOT suspicious for a vegetation in the setting of MSSA bacteremia, though a Lambl's excrescence cannot be excluded. The aortic  valve is tricuspid. Aortic valve regurgitation is trivial. A mobile vegetation is seen. Pulmonic Valve: The pulmonic valve was normal in structure. Pulmonic valve regurgitation is trivial. Aorta: The aortic root and ascending aorta are structurally normal, with no evidence of dilitation. IAS/Shunts: No atrial level shunt detected by color flow Doppler. Yvonne Kendall MD Electronically signed by Yvonne Kendall MD Signature Date/Time: 03/16/2021/1:01:32 PM    Final       Scheduled Meds:  diltiazem  120 mg Oral Daily   enoxaparin (LOVENOX) injection  40 mg Subcutaneous Q24H   feeding supplement  237 mL Oral TID BM   insulin aspart  0-5 Units Subcutaneous QHS   insulin aspart  0-9 Units Subcutaneous TID WC   sertraline  50 mg Oral Daily   triamcinolone ointment   Topical TID   valACYclovir  500 mg Oral BID   Continuous Infusions:  sodium chloride 10 mL/hr at 03/16/21 0739    ceFAZolin (ANCEF) IV 2 g (03/17/21 0942)     LOS: 6 days      Time spent: 25 minutes   Noralee Stain, DO [facility] 03/17/2021, 12:54 PM   Available via Epic secure chat 7am-7pm After these hours, please refer to coverage provider listed on [email or web address]

## 2021-03-18 DIAGNOSIS — A4101 Sepsis due to Methicillin susceptible Staphylococcus aureus: Secondary | ICD-10-CM | POA: Diagnosis not present

## 2021-03-18 LAB — CULTURE, BLOOD (ROUTINE X 2)
Culture: NO GROWTH
Culture: NO GROWTH
Special Requests: ADEQUATE
Special Requests: ADEQUATE

## 2021-03-18 LAB — BASIC METABOLIC PANEL
Anion gap: 6 (ref 5–15)
BUN: 11 mg/dL (ref 6–20)
CO2: 31 mmol/L (ref 22–32)
Calcium: 9 mg/dL (ref 8.9–10.3)
Chloride: 103 mmol/L (ref 98–111)
Creatinine, Ser: 0.9 mg/dL (ref 0.61–1.24)
GFR, Estimated: >60 mL/min (ref >60)
Glucose, Bld: 62 mg/dL — ABNORMAL LOW (ref 70–99)
Potassium: 3.7 mmol/L (ref 3.5–5.1)
Sodium: 140 mmol/L (ref 135–145)

## 2021-03-18 LAB — URINE DRUG SCREEN, QUALITATIVE (ARMC ONLY)
Amphetamines, Ur Screen: NOT DETECTED
Barbiturates, Ur Screen: NOT DETECTED
Benzodiazepine, Ur Scrn: NOT DETECTED
Cannabinoid 50 Ng, Ur ~~LOC~~: NOT DETECTED
Cocaine Metabolite,Ur ~~LOC~~: NOT DETECTED
MDMA (Ecstasy)Ur Screen: NOT DETECTED
Methadone Scn, Ur: NOT DETECTED
Opiate, Ur Screen: NOT DETECTED
Phencyclidine (PCP) Ur S: NOT DETECTED
Tricyclic, Ur Screen: NOT DETECTED

## 2021-03-18 LAB — HEPATIC FUNCTION PANEL
ALT: 33 U/L (ref 0–44)
AST: 28 U/L (ref 15–41)
Albumin: 4 g/dL (ref 3.5–5.0)
Alkaline Phosphatase: 69 U/L (ref 38–126)
Bilirubin, Direct: <0.1 mg/dL (ref 0.0–0.2)
Total Bilirubin: 0.4 mg/dL (ref 0.3–1.2)
Total Protein: 7.1 g/dL (ref 6.5–8.1)

## 2021-03-18 NOTE — Progress Notes (Signed)
PROGRESS NOTE    Randy Henry  XYI:[id number] DOB: 2000-08-03 DOA: 03/11/2021 PCP: [name], MD     Brief Narrative:  Randy Henry is a 20 y.o. male with medical history significant for COVID-19 infection in January 2022, atopic eczema, ADHD, substance use disorder, asthma, who presented to the hospital because of multiple skin lesions on his upper extremities.   He was found to have severe sepsis secondary to MSSA bacteremia.  Left upper extremity cellulitis was suspected as well.  Blood culture revealed MSSA.  TEE was concerning for aortic valve vegetation.  ID recommends IV cefazolin for a total of 6 weeks.     He developed SVT during his stay in the hospital so he was started on oral Cardizem.  New events last 24 hours / Subjective: Patient voices no new complaints or concerns.  Mother at bedside pulled me out of the room for discussion.  She is concerned that patient's friend that visited last night might be bringing in drugs although she could not confirm.  She is requesting urine drug screen.  Assessment & Plan:   Principal Problem:   Sepsis (HCC) Active Problems:   Bacteremia due to Staphylococcus aureus   SVT (supraventricular tachycardia) (HCC)   Severe sepsis secondary to MSSA bacteremia and ?left upper extremity cellulitis, suspected aortic valve endocarditis -TEE done on 03/16/2021 showed findings suspicious for vegetation on the aortic valve. -Continue IV cefazolin.  ID recommends total of 6 weeks of antibiotics. 9/15-10/26. Patient to remain inpatient for IV antibiotics due to hx of drug abuse   Atopic eczema, multiple scabbed lesions/rash on bilateral upper extremities, face, chest and back -Monkey pox viral DNA test was negative   SVT -Cardizem for rate control  ADHD -His mother said he does not take Evekeo at home and that he is medically nonadherent.   Substance use disorder -Patient said he uses cocaine and CBD Gummies.  He said he last used  cocaine about a week prior to admission.  He said he does not use methadone but his cocaine may have been laced with methadone.  He said he does not use any IV drugs.  Mother said patient had "snorted" prednisone and amphetamines on 03/10/2021.  Reportedly, the prednisone was prescribed for the new lesions that he had developed on his upper extremities.   DVT prophylaxis:  enoxaparin (LOVENOX) injection 40 mg Start: 03/11/21 2000 SCDs Start: 03/11/21 1939  Code Status:     Code Status Orders  (From admission, onward)           Start     Ordered   03/11/21 1939  Full code  Continuous        03/11/21 1938           Code Status History     This patient has a current code status but no historical code status.      Family Communication: Mom at bedside Disposition Plan:  Status is: Inpatient  Remains inpatient appropriate because:Inpatient level of care appropriate due to severity of illness  Dispo:  Patient From: Home  Planned Disposition: Home  Medically stable for discharge: No.  Patient will remain in the hospital for IV antibiotic through 10/26      Antimicrobials:  Anti-infectives (From admission, onward)    Start     Dose/Rate Route Frequency Ordered Stop   03/16/21 1830  ceFAZolin (ANCEF) IVPB 2g/100 mL premix        2 g 200 mL/hr over 30 Minutes  Intravenous Every 8 hours 03/16/21 1254     03/15/21 1045  valACYclovir (VALTREX) tablet 500 mg        500 mg Oral 2 times daily 03/15/21 0945     03/12/21 1200  vancomycin (VANCOREADY) IVPB 1750 mg/350 mL  Status:  Discontinued        1,750 mg 175 mL/hr over 120 Minutes Intravenous Every 12 hours 03/12/21 0742 03/12/21 1006   03/12/21 1100  vancomycin (VANCOREADY) IVPB 1500 mg/300 mL  Status:  Discontinued        1,500 mg 150 mL/hr over 120 Minutes Intravenous Every 12 hours 03/11/21 2150 03/12/21 0742   03/12/21 1100  ceFAZolin (ANCEF) IVPB 2g/100 mL premix  Status:  Discontinued        2 g 200 mL/hr over 30  Minutes Intravenous Every 8 hours 03/12/21 1006 03/16/21 1254   03/12/21 0000  ceFEPIme (MAXIPIME) 2 g in sodium chloride 0.9 % 100 mL IVPB  Status:  Discontinued        2 g 200 mL/hr over 30 Minutes Intravenous Every 8 hours 03/11/21 2037 03/12/21 1006   03/11/21 2200  vancomycin (VANCOCIN) IVPB 1000 mg/200 mL premix        1,000 mg 200 mL/hr over 60 Minutes Intravenous  Once 03/11/21 2037 03/12/21 0154   03/11/21 1745  ceFEPIme (MAXIPIME) 2 g in sodium chloride 0.9 % 100 mL IVPB        2 g 200 mL/hr over 30 Minutes Intravenous  Once 03/11/21 1733 03/11/21 1831   03/11/21 1745  metroNIDAZOLE (FLAGYL) IVPB 500 mg        500 mg 100 mL/hr over 60 Minutes Intravenous  Once 03/11/21 1733 03/11/21 1942   03/11/21 1745  vancomycin (VANCOCIN) IVPB 1000 mg/200 mL premix        1,000 mg 200 mL/hr over 60 Minutes Intravenous  Once 03/11/21 1733 03/11/21 1907        Objective: Vitals:   03/17/21 0756 03/17/21 1312 03/17/21 1630 03/18/21 0758  BP: 135/76  140/61 (!) 146/78  Pulse: 68  84 85  Resp: 16  16 17   Temp: 98.3 F (36.8 C)  98.6 F (37 C) 98.1 F (36.7 C)  TempSrc: Oral  Oral Oral  SpO2: 98%  97% 97%  Weight:  79.9 kg    Height:        Intake/Output Summary (Last 24 hours) at 03/18/2021 1051 Last data filed at 03/17/2021 1842 Gross per 24 hour  Intake 600 ml  Output --  Net 600 ml    Filed Weights   03/11/21 1804 03/11/21 2317 03/17/21 1312  Weight: 89.8 kg 82.5 kg 79.9 kg    Examination: General exam: Appears calm and comfortable    Data Reviewed: I have personally reviewed following labs and imaging studies  CBC: Recent Labs  Lab 03/11/21 1545 03/12/21 0436  WBC 19.7* 8.5  NEUTROABS 17.0*  --   HGB 16.6 14.0  HCT 44.8 39.8  MCV 84.7 86.0  PLT 337 242    Basic Metabolic Panel: Recent Labs  Lab 03/11/21 1545 03/11/21 1746 03/12/21 0436 03/13/21 0444  NA 135  --  136 139  K 3.4*  --  3.7 3.6  CL 99  --  103 102  CO2 21*  --  27 30  GLUCOSE 167*   --  107* 84  BUN 13  --  9 5*  CREATININE 1.06  --  0.94 0.99  CALCIUM 9.6  --  8.7* 8.7*  MG  --  2.2  --  2.2    GFR: Estimated Creatinine Clearance: 126.8 mL/min (by C-G formula based on SCr of 0.99 mg/dL). Liver Function Tests: Recent Labs  Lab 03/11/21 1545  AST 40  ALT 52*  ALKPHOS 80  BILITOT 0.9  PROT 7.8  ALBUMIN 4.7    No results for input(s): LIPASE, AMYLASE in the last 168 hours. No results for input(s): AMMONIA in the last 168 hours. Coagulation Profile: Recent Labs  Lab 03/15/21 1543  INR 0.9    Cardiac Enzymes: No results for input(s): CKTOTAL, CKMB, CKMBINDEX, TROPONINI in the last 168 hours. BNP (last 3 results) No results for input(s): PROBNP in the last 8760 hours. HbA1C: No results for input(s): HGBA1C in the last 72 hours. CBG: Recent Labs  Lab 03/16/21 1718 03/16/21 2035 03/17/21 0753 03/17/21 1215 03/17/21 1956  GLUCAP 99 139* 89 98 136*    Lipid Profile: No results for input(s): CHOL, HDL, LDLCALC, TRIG, CHOLHDL, LDLDIRECT in the last 72 hours. Thyroid Function Tests: No results for input(s): TSH, T4TOTAL, FREET4, T3FREE, THYROIDAB in the last 72 hours. Anemia Panel: No results for input(s): VITAMINB12, FOLATE, FERRITIN, TIBC, IRON, RETICCTPCT in the last 72 hours. Sepsis Labs: Recent Labs  Lab 03/11/21 1545 03/11/21 1746 03/12/21 0436  LATICACIDVEN 5.3* 3.6* 0.8     Recent Results (from the past 240 hour(s))  Monkeypox Virus DNA, Qualitative Real-Time PCR     Status: None   Collection Time: 03/11/21  5:46 PM   Specimen: Sterile Swab  Result Value Ref Range Status   Orthopoxvirus DNA, QL PCR NOT DETECTED NOT DETECTED Final    Comment: (NOTE) Non-variola Orthopoxvirus DNA not detected by real-time PCR. Performed At: [facility] [address] [place], [place] [id number] Jolene Schimke MD YH:[id number]   Blood culture (routine x 2)     Status: Abnormal   Collection Time: 03/11/21  5:46 PM   Specimen: BLOOD   Result Value Ref Range Status   Specimen Description   Final    BLOOD LEFT ANTECUBITAL Performed at [facility], [address] [place], [place] [address]    Special Requests   Final    BOTTLES DRAWN AEROBIC AND ANAEROBIC Blood Culture adequate volume Performed at [facility], [address] [place], [place] [address]    Culture  Setup Time   Final    GRAM POSITIVE COCCI IN BOTH AEROBIC AND ANAEROBIC BOTTLES Organism ID to follow CRITICAL RESULT CALLED TO, READ BACK BY AND VERIFIED WITH: Algie Coffer, PHARMD AT 0929 ON 03/12/21 BY GM Performed at [facility], [address] [place], [place] [address]    Culture STAPHYLOCOCCUS AUREUS (A)  Final   Report Status 03/14/2021 FINAL  Final   Organism ID, Bacteria STAPHYLOCOCCUS AUREUS  Final      Susceptibility   Staphylococcus aureus - MIC*    CIPROFLOXACIN <=0.5 SENSITIVE Sensitive     ERYTHROMYCIN <=0.25 SENSITIVE Sensitive     GENTAMICIN <=0.5 SENSITIVE Sensitive     OXACILLIN 0.5 SENSITIVE Sensitive     TETRACYCLINE <=1 SENSITIVE Sensitive     VANCOMYCIN 1 SENSITIVE Sensitive     TRIMETH/SULFA <=10 SENSITIVE Sensitive     CLINDAMYCIN <=0.25 SENSITIVE Sensitive     RIFAMPIN <=0.5 SENSITIVE Sensitive     Inducible Clindamycin NEGATIVE Sensitive     * STAPHYLOCOCCUS AUREUS  Blood culture (routine x 2)     Status: Abnormal   Collection Time: 03/11/21  5:46 PM   Specimen:  BLOOD  Result Value Ref Range Status   Specimen Description   Final    BLOOD RIGHT ANTECUBITAL Performed at [facility], [address] [place], [place] [address]    Special Requests   Final    BOTTLES DRAWN AEROBIC AND ANAEROBIC Blood Culture adequate volume Performed at [facility], [address] [place], [place] [address]    Culture  Setup Time   Final    GRAM POSITIVE COCCI ANAEROBIC BOTTLE ONLY CRITICAL RESULT CALLED TO, READ BACK BY AND VERIFIED WITH: CAROLINE CHILDS 03/14/21 1343  AMK Performed at [facility], [address] [place], [place] [address]    Culture STAPHYLOCOCCUS AUREUS (A)  Final   Report Status 03/16/2021 FINAL  Final   Organism ID, Bacteria STAPHYLOCOCCUS AUREUS  Final      Susceptibility   Staphylococcus aureus - MIC*    CIPROFLOXACIN <=0.5 SENSITIVE Sensitive     ERYTHROMYCIN <=0.25 SENSITIVE Sensitive     GENTAMICIN <=0.5 SENSITIVE Sensitive     OXACILLIN 0.5 SENSITIVE Sensitive     TETRACYCLINE <=1 SENSITIVE Sensitive     VANCOMYCIN 1 SENSITIVE Sensitive     TRIMETH/SULFA <=10 SENSITIVE Sensitive     CLINDAMYCIN <=0.25 SENSITIVE Sensitive     RIFAMPIN <=0.5 SENSITIVE Sensitive     Inducible Clindamycin NEGATIVE Sensitive     * STAPHYLOCOCCUS AUREUS  Aerobic Culture w Gram Stain (superficial specimen)     Status: None   Collection Time: 03/11/21  5:46 PM   Specimen: Arm  Result Value Ref Range Status   Specimen Description   Final    ARM Performed at [facility], [address] [place], [place] [address]    Special Requests   Final    NONE Performed at [facility], [address] [place], [place] [address]    Gram Stain NO WBC SEEN NO ORGANISMS SEEN   Final   Culture   Final    ABUNDANT STAPHYLOCOCCUS AUREUS ABUNDANT STREPTOCOCCUS GROUP C Beta hemolytic streptococci are predictably susceptible to penicillin and other beta lactams. Susceptibility testing not routinely performed. Performed at [facility], [address], [place], [place] [address]    Report Status 03/14/2021 FINAL  Final   Organism ID, Bacteria STAPHYLOCOCCUS AUREUS  Final      Susceptibility   Staphylococcus aureus - MIC*    CIPROFLOXACIN <=0.5 SENSITIVE Sensitive     ERYTHROMYCIN <=0.25 SENSITIVE Sensitive     GENTAMICIN <=0.5 SENSITIVE Sensitive     OXACILLIN 0.5 SENSITIVE Sensitive     TETRACYCLINE <=1 SENSITIVE Sensitive     VANCOMYCIN <=0.5 SENSITIVE Sensitive     TRIMETH/SULFA <=10 SENSITIVE Sensitive      CLINDAMYCIN <=0.25 SENSITIVE Sensitive     RIFAMPIN <=0.5 SENSITIVE Sensitive     Inducible Clindamycin NEGATIVE Sensitive     * ABUNDANT STAPHYLOCOCCUS AUREUS  Blood Culture ID Panel (Reflexed)     Status: Abnormal   Collection Time: 03/11/21  5:46 PM  Result Value Ref Range Status   Enterococcus faecalis NOT DETECTED NOT DETECTED Final   Enterococcus Faecium NOT DETECTED NOT DETECTED Final   Listeria monocytogenes NOT DETECTED NOT DETECTED Final   Staphylococcus species DETECTED (A) NOT DETECTED Final    Comment: CRITICAL RESULT CALLED TO, READ BACK BY AND VERIFIED WITH: Algie Coffer, PHARMD AT 0929 ON 03/12/21 BY GM    Staphylococcus aureus (BCID) DETECTED (A) NOT DETECTED Final    Comment: CRITICAL RESULT CALLED TO, READ BACK BY AND  VERIFIED WITH: Algie Coffer, PHARMD AT [phone] ON 03/12/21 BY GM    Staphylococcus epidermidis NOT DETECTED NOT DETECTED Final   Staphylococcus lugdunensis NOT DETECTED NOT DETECTED Final   Streptococcus species NOT DETECTED NOT DETECTED Final   Streptococcus agalactiae NOT DETECTED NOT DETECTED Final   Streptococcus pneumoniae NOT DETECTED NOT DETECTED Final   Streptococcus pyogenes NOT DETECTED NOT DETECTED Final   A.calcoaceticus-baumannii NOT DETECTED NOT DETECTED Final   Bacteroides fragilis NOT DETECTED NOT DETECTED Final   Enterobacterales NOT DETECTED NOT DETECTED Final   Enterobacter cloacae complex NOT DETECTED NOT DETECTED Final   Escherichia coli NOT DETECTED NOT DETECTED Final   Klebsiella aerogenes NOT DETECTED NOT DETECTED Final   Klebsiella oxytoca NOT DETECTED NOT DETECTED Final   Klebsiella pneumoniae NOT DETECTED NOT DETECTED Final   Proteus species NOT DETECTED NOT DETECTED Final   Salmonella species NOT DETECTED NOT DETECTED Final   Serratia marcescens NOT DETECTED NOT DETECTED Final   Haemophilus influenzae NOT DETECTED NOT DETECTED Final   Neisseria meningitidis NOT DETECTED NOT DETECTED Final   Pseudomonas aeruginosa NOT  DETECTED NOT DETECTED Final   Stenotrophomonas maltophilia NOT DETECTED NOT DETECTED Final   Candida albicans NOT DETECTED NOT DETECTED Final   Candida auris NOT DETECTED NOT DETECTED Final   Candida glabrata NOT DETECTED NOT DETECTED Final   Candida krusei NOT DETECTED NOT DETECTED Final   Candida parapsilosis NOT DETECTED NOT DETECTED Final   Candida tropicalis NOT DETECTED NOT DETECTED Final   Cryptococcus neoformans/gattii NOT DETECTED NOT DETECTED Final   Meth resistant mecA/C and MREJ NOT DETECTED NOT DETECTED Final    Comment: Performed at [facility], [address] [place], [place] [address]  MRSA Next Gen by PCR, Nasal     Status: None   Collection Time: 03/12/21  1:03 AM   Specimen: Nasal Mucosa; Nasal Swab  Result Value Ref Range Status   MRSA by PCR Next Gen NOT DETECTED NOT DETECTED Final    Comment: (NOTE) The GeneXpert MRSA Assay (FDA approved for NASAL specimens only), is one component of a comprehensive MRSA colonization surveillance program. It is not intended to diagnose MRSA infection nor to guide or monitor treatment for MRSA infections. Test performance is not FDA approved in patients less than 7 years old. Performed at [facility], [address] [place], [place] [address]   Chlamydia/NGC rt PCR [facility] only)     Status: None   Collection Time: 03/12/21  1:55 AM   Specimen: Urine  Result Value Ref Range Status   Specimen source GC/Chlam URINE, RANDOM  Final   Chlamydia Tr NOT DETECTED NOT DETECTED Final   N gonorrhoeae NOT DETECTED NOT DETECTED Final    Comment: (NOTE) This CT/NG assay has not been evaluated in patients with a history of  hysterectomy. Performed at [facility], [address] [place], [place] [address]   CULTURE, BLOOD (ROUTINE X 2) w Reflex to ID Panel     Status: None   Collection Time: 03/13/21 12:11 AM   Specimen: BLOOD  Result Value Ref Range Status   Specimen Description BLOOD BLOOD LEFT  FOREARM  Final   Special Requests   Final    BOTTLES DRAWN AEROBIC AND ANAEROBIC Blood Culture adequate volume   Culture   Final    NO GROWTH 5 DAYS Performed at [facility], [address], [place], [place] [address]    Report Status 03/18/2021 FINAL  Final  CULTURE, BLOOD (ROUTINE X 2)  w Reflex to ID Panel     Status: None   Collection Time: 03/13/21 12:11 AM   Specimen: BLOOD  Result Value Ref Range Status   Specimen Description BLOOD BLOOD LEFT HAND  Final   Special Requests   Final    BOTTLES DRAWN AEROBIC AND ANAEROBIC Blood Culture adequate volume   Culture   Final    NO GROWTH 5 DAYS Performed at [facility], [address] [place], [place] [address]    Report Status 03/18/2021 FINAL  Final       Radiology Studies: No results found.    Scheduled Meds:  diltiazem  120 mg Oral Daily   enoxaparin (LOVENOX) injection  40 mg Subcutaneous Q24H   feeding supplement  237 mL Oral TID BM   sertraline  50 mg Oral Daily   triamcinolone ointment   Topical TID   valACYclovir  500 mg Oral BID   Continuous Infusions:  sodium chloride 10 mL/hr at 03/16/21 0739    ceFAZolin (ANCEF) IV 2 g (03/18/21 1012)     LOS: 7 days      Time spent: 20 minutes   Noralee Stain, DO [facility] 03/18/2021, 10:51 AM   Available via Epic secure chat 7am-7pm After these hours, please refer to coverage provider listed on [email or web address]

## 2021-03-19 DIAGNOSIS — R7881 Bacteremia: Secondary | ICD-10-CM | POA: Diagnosis not present

## 2021-03-19 DIAGNOSIS — B9561 Methicillin susceptible Staphylococcus aureus infection as the cause of diseases classified elsewhere: Secondary | ICD-10-CM | POA: Diagnosis not present

## 2021-03-19 DIAGNOSIS — A4101 Sepsis due to Methicillin susceptible Staphylococcus aureus: Secondary | ICD-10-CM | POA: Diagnosis not present

## 2021-03-19 MED ORDER — HYDROCERIN EX CREA
TOPICAL_CREAM | Freq: Two times a day (BID) | CUTANEOUS | Status: DC
  Filled 2021-03-19: qty 113

## 2021-03-19 NOTE — Progress Notes (Signed)
PROGRESS NOTE    Randy Henry  LYY:[id number] DOB: January 26, 2001 DOA: 03/11/2021 PCP: [name], MD     Brief Narrative:  Randy Henry is a 20 y.o. male with medical history significant for COVID-19 infection in January 2022, atopic eczema, ADHD, substance use disorder, asthma, who presented to the hospital because of multiple skin lesions on his upper extremities.   He was found to have severe sepsis secondary to MSSA bacteremia.  Left upper extremity cellulitis was suspected as well.  Blood culture revealed MSSA.  TEE was concerning for aortic valve vegetation.  ID recommends IV cefazolin for a total of 6 weeks.     He developed SVT during his stay in the hospital so he was started on oral Cardizem.  New events last 24 hours / Subjective: No new issues reported  Assessment & Plan:   Principal Problem:   Sepsis (HCC) Active Problems:   Bacteremia due to Staphylococcus aureus   SVT (supraventricular tachycardia) (HCC)   Severe sepsis secondary to MSSA bacteremia and ?left upper extremity cellulitis, suspected aortic valve endocarditis -TEE done on 03/16/2021 showed findings suspicious for vegetation on the aortic valve. -Continue IV cefazolin.  ID recommends total of 6 weeks of antibiotics. 9/15-10/26. Patient to remain inpatient for IV antibiotics due to hx of drug abuse   Atopic eczema, multiple scabbed lesions/rash on bilateral upper extremities, face, chest and back -Monkey pox viral DNA test was negative   SVT -Cardizem for rate control  ADHD -His mother said he does not take Evekeo at home and that he is medically nonadherent.   Substance use disorder -Patient said he uses cocaine and CBD Gummies.  He said he last used cocaine about a week prior to admission.  He said he does not use methadone but his cocaine may have been laced with methadone.  He said he does not use any IV drugs.  Mother said patient had "snorted" prednisone and amphetamines on 03/10/2021.   Reportedly, the prednisone was prescribed for the new lesions that he had developed on his upper extremities.   DVT prophylaxis:  enoxaparin (LOVENOX) injection 40 mg Start: 03/11/21 2000 SCDs Start: 03/11/21 1939  Code Status:     Code Status Orders  (From admission, onward)           Start     Ordered   03/11/21 1939  Full code  Continuous        03/11/21 1938           Code Status History     This patient has a current code status but no historical code status.      Family Communication: No family at bedside Disposition Plan:  Status is: Inpatient  Remains inpatient appropriate because:Inpatient level of care appropriate due to severity of illness  Dispo:  Patient From: Home  Planned Disposition: Home  Medically stable for discharge: No.  Patient will remain in the hospital for IV antibiotic through 10/26      Antimicrobials:  Anti-infectives (From admission, onward)    Start     Dose/Rate Route Frequency Ordered Stop   03/16/21 1830  ceFAZolin (ANCEF) IVPB 2g/100 mL premix        2 g 200 mL/hr over 30 Minutes Intravenous Every 8 hours 03/16/21 1254     03/15/21 1045  valACYclovir (VALTREX) tablet 500 mg        500 mg Oral 2 times daily 03/15/21 0945     03/12/21 1200  vancomycin (VANCOREADY)  IVPB 1750 mg/350 mL  Status:  Discontinued        1,750 mg 175 mL/hr over 120 Minutes Intravenous Every 12 hours 03/12/21 0742 03/12/21 1006   03/12/21 1100  vancomycin (VANCOREADY) IVPB 1500 mg/300 mL  Status:  Discontinued        1,500 mg 150 mL/hr over 120 Minutes Intravenous Every 12 hours 03/11/21 2150 03/12/21 0742   03/12/21 1100  ceFAZolin (ANCEF) IVPB 2g/100 mL premix  Status:  Discontinued        2 g 200 mL/hr over 30 Minutes Intravenous Every 8 hours 03/12/21 1006 03/16/21 1254   03/12/21 0000  ceFEPIme (MAXIPIME) 2 g in sodium chloride 0.9 % 100 mL IVPB  Status:  Discontinued        2 g 200 mL/hr over 30 Minutes Intravenous Every 8 hours 03/11/21  2037 03/12/21 1006   03/11/21 2200  vancomycin (VANCOCIN) IVPB 1000 mg/200 mL premix        1,000 mg 200 mL/hr over 60 Minutes Intravenous  Once 03/11/21 2037 03/12/21 0154   03/11/21 1745  ceFEPIme (MAXIPIME) 2 g in sodium chloride 0.9 % 100 mL IVPB        2 g 200 mL/hr over 30 Minutes Intravenous  Once 03/11/21 1733 03/11/21 1831   03/11/21 1745  metroNIDAZOLE (FLAGYL) IVPB 500 mg        500 mg 100 mL/hr over 60 Minutes Intravenous  Once 03/11/21 1733 03/11/21 1942   03/11/21 1745  vancomycin (VANCOCIN) IVPB 1000 mg/200 mL premix        1,000 mg 200 mL/hr over 60 Minutes Intravenous  Once 03/11/21 1733 03/11/21 1907        Objective: Vitals:   03/18/21 0758 03/18/21 1953 03/19/21 0502 03/19/21 0831  BP: (!) 146/78 (!) 158/91 130/70 137/85  Pulse: 85 96 78 85  Resp: 17 18 18    Temp: 98.1 F (36.7 C) 97.7 F (36.5 C) 98 F (36.7 C) 97.8 F (36.6 C)  TempSrc: Oral Oral Oral Oral  SpO2: 97% 98% 99% 100%  Weight:      Height:        Intake/Output Summary (Last 24 hours) at 03/19/2021 1016 Last data filed at 03/18/2021 1847 Gross per 24 hour  Intake 360 ml  Output --  Net 360 ml    Filed Weights   03/11/21 1804 03/11/21 2317 03/17/21 1312  Weight: 89.8 kg 82.5 kg 79.9 kg    Examination: General exam: Appears calm and comfortable    Data Reviewed: I have personally reviewed following labs and imaging studies  CBC: No results for input(s): WBC, NEUTROABS, HGB, HCT, MCV, PLT in the last 168 hours.  Basic Metabolic Panel: Recent Labs  Lab 03/13/21 0444 03/18/21 1137  NA 139 140  K 3.6 3.7  CL 102 103  CO2 30 31  GLUCOSE 84 62*  BUN 5* 11  CREATININE 0.99 0.90  CALCIUM 8.7* 9.0  MG 2.2  --     GFR: Estimated Creatinine Clearance: 139.4 mL/min (by C-G formula based on SCr of 0.9 mg/dL). Liver Function Tests: Recent Labs  Lab 03/18/21 1137  AST 28  ALT 33  ALKPHOS 69  BILITOT 0.4  PROT 7.1  ALBUMIN 4.0    No results for input(s): LIPASE,  AMYLASE in the last 168 hours. No results for input(s): AMMONIA in the last 168 hours. Coagulation Profile: Recent Labs  Lab 03/15/21 1543  INR 0.9    Cardiac Enzymes: No results for input(s): CKTOTAL, CKMB,  CKMBINDEX, TROPONINI in the last 168 hours. BNP (last 3 results) No results for input(s): PROBNP in the last 8760 hours. HbA1C: No results for input(s): HGBA1C in the last 72 hours. CBG: Recent Labs  Lab 03/16/21 1718 03/16/21 2035 03/17/21 0753 03/17/21 1215 03/17/21 1956  GLUCAP 99 139* 89 98 136*    Lipid Profile: No results for input(s): CHOL, HDL, LDLCALC, TRIG, CHOLHDL, LDLDIRECT in the last 72 hours. Thyroid Function Tests: No results for input(s): TSH, T4TOTAL, FREET4, T3FREE, THYROIDAB in the last 72 hours. Anemia Panel: No results for input(s): VITAMINB12, FOLATE, FERRITIN, TIBC, IRON, RETICCTPCT in the last 72 hours. Sepsis Labs: No results for input(s): PROCALCITON, LATICACIDVEN in the last 168 hours.   Recent Results (from the past 240 hour(s))  Monkeypox Virus DNA, Qualitative Real-Time PCR     Status: None   Collection Time: 03/11/21  5:46 PM   Specimen: Sterile Swab  Result Value Ref Range Status   Orthopoxvirus DNA, QL PCR NOT DETECTED NOT DETECTED Final    Comment: (NOTE) Non-variola Orthopoxvirus DNA not detected by real-time PCR. Performed At: [facility] [address] [place], [place] [id number] Jolene Schimke MD WU:[id number]   Blood culture (routine x 2)     Status: Abnormal   Collection Time: 03/11/21  5:46 PM   Specimen: BLOOD  Result Value Ref Range Status   Specimen Description   Final    BLOOD LEFT ANTECUBITAL Performed at [facility], [address] [place], [place] [address]    Special Requests   Final    BOTTLES DRAWN AEROBIC AND ANAEROBIC Blood Culture adequate volume Performed at [facility], [address] [place], [place] [address]    Culture  Setup Time   Final    GRAM POSITIVE  COCCI IN BOTH AEROBIC AND ANAEROBIC BOTTLES Organism ID to follow CRITICAL RESULT CALLED TO, READ BACK BY AND VERIFIED WITH: Randy Henry, PHARMD AT 0929 ON 03/12/21 BY GM Performed at [facility], [address] [place], [place] [address]    Culture STAPHYLOCOCCUS AUREUS (A)  Final   Report Status 03/14/2021 FINAL  Final   Organism ID, Bacteria STAPHYLOCOCCUS AUREUS  Final      Susceptibility   Staphylococcus aureus - MIC*    CIPROFLOXACIN <=0.5 SENSITIVE Sensitive     ERYTHROMYCIN <=0.25 SENSITIVE Sensitive     GENTAMICIN <=0.5 SENSITIVE Sensitive     OXACILLIN 0.5 SENSITIVE Sensitive     TETRACYCLINE <=1 SENSITIVE Sensitive     VANCOMYCIN 1 SENSITIVE Sensitive     TRIMETH/SULFA <=10 SENSITIVE Sensitive     CLINDAMYCIN <=0.25 SENSITIVE Sensitive     RIFAMPIN <=0.5 SENSITIVE Sensitive     Inducible Clindamycin NEGATIVE Sensitive     * STAPHYLOCOCCUS AUREUS  Blood culture (routine x 2)     Status: Abnormal   Collection Time: 03/11/21  5:46 PM   Specimen: BLOOD  Result Value Ref Range Status   Specimen Description   Final    BLOOD RIGHT ANTECUBITAL Performed at [facility], [address], [place], [place] [address]    Special Requests   Final    BOTTLES DRAWN AEROBIC AND ANAEROBIC Blood Culture adequate volume Performed at [facility], [address] [place], [place] [address]    Culture  Setup Time   Final    GRAM POSITIVE COCCI ANAEROBIC BOTTLE ONLY CRITICAL RESULT CALLED TO, READ BACK BY AND VERIFIED WITH: Randy Henry 03/14/21 1343 AMK Performed at [facility], [address] [place], [place] [address]    Culture STAPHYLOCOCCUS AUREUS (A)  Final   Report Status 03/16/2021 FINAL  Final   Organism ID, Bacteria STAPHYLOCOCCUS AUREUS  Final      Susceptibility   Staphylococcus aureus - MIC*    CIPROFLOXACIN <=0.5 SENSITIVE Sensitive     ERYTHROMYCIN <=0.25 SENSITIVE Sensitive     GENTAMICIN <=0.5 SENSITIVE Sensitive      OXACILLIN 0.5 SENSITIVE Sensitive     TETRACYCLINE <=1 SENSITIVE Sensitive     VANCOMYCIN 1 SENSITIVE Sensitive     TRIMETH/SULFA <=10 SENSITIVE Sensitive     CLINDAMYCIN <=0.25 SENSITIVE Sensitive     RIFAMPIN <=0.5 SENSITIVE Sensitive     Inducible Clindamycin NEGATIVE Sensitive     * STAPHYLOCOCCUS AUREUS  Aerobic Culture w Gram Stain (superficial specimen)     Status: None   Collection Time: 03/11/21  5:46 PM   Specimen: Arm  Result Value Ref Range Status   Specimen Description   Final    ARM Performed at [facility], [address], [place], [place] [address]    Special Requests   Final    NONE Performed at [facility], [address] [place], [place] [address]    Gram Stain NO WBC SEEN NO ORGANISMS SEEN   Final   Culture   Final    ABUNDANT STAPHYLOCOCCUS AUREUS ABUNDANT STREPTOCOCCUS GROUP C Beta hemolytic streptococci are predictably susceptible to penicillin and other beta lactams. Susceptibility testing not routinely performed. Performed at [facility], [address], [place], [place] [address]    Report Status 03/14/2021 FINAL  Final   Organism ID, Bacteria STAPHYLOCOCCUS AUREUS  Final      Susceptibility   Staphylococcus aureus - MIC*    CIPROFLOXACIN <=0.5 SENSITIVE Sensitive     ERYTHROMYCIN <=0.25 SENSITIVE Sensitive     GENTAMICIN <=0.5 SENSITIVE Sensitive     OXACILLIN 0.5 SENSITIVE Sensitive     TETRACYCLINE <=1 SENSITIVE Sensitive     VANCOMYCIN <=0.5 SENSITIVE Sensitive     TRIMETH/SULFA <=10 SENSITIVE Sensitive     CLINDAMYCIN <=0.25 SENSITIVE Sensitive     RIFAMPIN <=0.5 SENSITIVE Sensitive     Inducible Clindamycin NEGATIVE Sensitive     * ABUNDANT STAPHYLOCOCCUS AUREUS  Blood Culture ID Panel (Reflexed)     Status: Abnormal   Collection Time: 03/11/21  5:46 PM  Result Value Ref Range Status   Enterococcus faecalis NOT DETECTED NOT DETECTED Final   Enterococcus Faecium NOT DETECTED NOT DETECTED Final   Listeria  monocytogenes NOT DETECTED NOT DETECTED Final   Staphylococcus species DETECTED (A) NOT DETECTED Final    Comment: CRITICAL RESULT CALLED TO, READ BACK BY AND VERIFIED WITH: Randy Henry, PHARMD AT 0929 ON 03/12/21 BY GM    Staphylococcus aureus (BCID) DETECTED (A) NOT DETECTED Final    Comment: CRITICAL RESULT CALLED TO, READ BACK BY AND VERIFIED WITH: Randy Henry, PHARMD AT 0929 ON 03/12/21 BY GM    Staphylococcus epidermidis NOT DETECTED NOT DETECTED Final   Staphylococcus lugdunensis NOT DETECTED NOT DETECTED Final   Streptococcus species NOT DETECTED NOT DETECTED Final   Streptococcus agalactiae NOT DETECTED NOT DETECTED Final   Streptococcus pneumoniae NOT DETECTED NOT DETECTED Final   Streptococcus pyogenes NOT DETECTED NOT DETECTED Final   A.calcoaceticus-baumannii NOT DETECTED NOT DETECTED Final   Bacteroides fragilis NOT DETECTED NOT DETECTED Final   Enterobacterales NOT DETECTED NOT DETECTED Final   Enterobacter cloacae complex NOT DETECTED NOT DETECTED Final   Escherichia coli NOT  DETECTED NOT DETECTED Final   Klebsiella aerogenes NOT DETECTED NOT DETECTED Final   Klebsiella oxytoca NOT DETECTED NOT DETECTED Final   Klebsiella pneumoniae NOT DETECTED NOT DETECTED Final   Proteus species NOT DETECTED NOT DETECTED Final   Salmonella species NOT DETECTED NOT DETECTED Final   Serratia marcescens NOT DETECTED NOT DETECTED Final   Haemophilus influenzae NOT DETECTED NOT DETECTED Final   Neisseria meningitidis NOT DETECTED NOT DETECTED Final   Pseudomonas aeruginosa NOT DETECTED NOT DETECTED Final   Stenotrophomonas maltophilia NOT DETECTED NOT DETECTED Final   Candida albicans NOT DETECTED NOT DETECTED Final   Candida auris NOT DETECTED NOT DETECTED Final   Candida glabrata NOT DETECTED NOT DETECTED Final   Candida krusei NOT DETECTED NOT DETECTED Final   Candida parapsilosis NOT DETECTED NOT DETECTED Final   Candida tropicalis NOT DETECTED NOT DETECTED Final   Cryptococcus  neoformans/gattii NOT DETECTED NOT DETECTED Final   Meth resistant mecA/C and MREJ NOT DETECTED NOT DETECTED Final    Comment: Performed at [facility], [address] [place], [place] [address]  MRSA Next Gen by PCR, Nasal     Status: None   Collection Time: 03/12/21  1:03 AM   Specimen: Nasal Mucosa; Nasal Swab  Result Value Ref Range Status   MRSA by PCR Next Gen NOT DETECTED NOT DETECTED Final    Comment: (NOTE) The GeneXpert MRSA Assay (FDA approved for NASAL specimens only), is one component of a comprehensive MRSA colonization surveillance program. It is not intended to diagnose MRSA infection nor to guide or monitor treatment for MRSA infections. Test performance is not FDA approved in patients less than 68 years old. Performed at [facility], [address] [place], [place] [address]   Chlamydia/NGC rt PCR [facility] only)     Status: None   Collection Time: 03/12/21  1:55 AM   Specimen: Urine  Result Value Ref Range Status   Specimen source GC/Chlam URINE, RANDOM  Final   Chlamydia Tr NOT DETECTED NOT DETECTED Final   N gonorrhoeae NOT DETECTED NOT DETECTED Final    Comment: (NOTE) This CT/NG assay has not been evaluated in patients with a history of  hysterectomy. Performed at [facility], [address] [place], [place] [address]   CULTURE, BLOOD (ROUTINE X 2) w Reflex to ID Panel     Status: None   Collection Time: 03/13/21 12:11 AM   Specimen: BLOOD  Result Value Ref Range Status   Specimen Description BLOOD BLOOD LEFT FOREARM  Final   Special Requests   Final    BOTTLES DRAWN AEROBIC AND ANAEROBIC Blood Culture adequate volume   Culture   Final    NO GROWTH 5 DAYS Performed at [facility], [address] [place], [place] [address]    Report Status 03/18/2021 FINAL  Final  CULTURE, BLOOD (ROUTINE X 2) w Reflex to ID Panel     Status: None   Collection Time: 03/13/21 12:11 AM   Specimen: BLOOD  Result Value Ref Range  Status   Specimen Description BLOOD BLOOD LEFT HAND  Final   Special Requests   Final    BOTTLES DRAWN AEROBIC AND ANAEROBIC Blood Culture adequate volume   Culture   Final    NO GROWTH 5 DAYS Performed at [facility], [address], [place], [place] [address]    Report Status 03/18/2021 FINAL  Final       Radiology Studies: No results found.    Scheduled  Meds:  diltiazem  120 mg Oral Daily   enoxaparin (LOVENOX) injection  40 mg Subcutaneous Q24H   feeding supplement  237 mL Oral TID BM   sertraline  50 mg Oral Daily   triamcinolone ointment   Topical TID   valACYclovir  500 mg Oral BID   Continuous Infusions:  sodium chloride 10 mL/hr at 03/16/21 0739    ceFAZolin (ANCEF) IV 2 g (03/19/21 0919)     LOS: 8 days      Time spent: 20 minutes   Noralee Stain, DO [facility] 03/19/2021, 10:16 AM   Available via Epic secure chat 7am-7pm After these hours, please refer to coverage provider listed on [email or web address]

## 2021-03-19 NOTE — Progress Notes (Signed)
ID Patient sitting in the chair.  No specific complaints No fever Says the wounds on his arm is healing except for one on the left forearm which is covered with a dressing from the IV line and that is irritating  Patient Vitals for the past 24 hrs:  BP Temp Temp src Pulse Resp SpO2  03/19/21 1924 (!) 147/73 98.3 F (36.8 C) Oral 82 16 99 %  03/19/21 1509 138/82 98.7 F (37.1 C) Oral 96 -- 99 %  03/19/21 0831 137/85 97.8 F (36.6 C) Oral 85 -- 100 %  03/19/21 0502 130/70 98 F (36.7 C) Oral 78 18 99 %    Chest Cta [id number] Abd soft Skin: excoriation and papular eruption face Wounds on his forearms and arms b/l is better  Lab CBC Latest Ref Rng & Units 03/12/2021 03/11/2021 04/08/2018  WBC 4.0 - 10.5 K/uL 8.5 19.7(H) 7.7  Hemoglobin 13.0 - 17.0 g/dL 74.9 44.9 16.5(H)  Hematocrit 39.0 - 52.0 % 39.8 44.8 45.6  Platelets 150 - 400 K/uL 242 337 290    CMP Latest Ref Rng & Units 03/18/2021 03/13/2021 03/12/2021  Glucose 70 - 99 mg/dL 67(R) 84 916(B)  BUN 6 - 20 mg/dL 11 5(L) 9  Creatinine 8.46 - 1.24 mg/dL 6.59 9.35 7.01  Sodium 135 - 145 mmol/L 140 139 136  Potassium 3.5 - 5.1 mmol/L 3.7 3.6 3.7  Chloride 98 - 111 mmol/L 103 102 103  CO2 22 - 32 mmol/L 31 30 27   Calcium 8.9 - 10.3 mg/dL 9.0 ) 7.7(L)  Total Protein 6.5 - 8.1 g/dL 7.1 - -  Total Bilirubin 0.3 - 1.2 mg/dL 0.4 - -  Alkaline Phos 38 - 126 U/L 69 - -  AST 15 - 41 U/L 28 - -  ALT 0 - 44 U/L 33 - -     Impression/recommendation MSSA bacteremia. MSSA wound on the skin  TEE suggestive of a tiny mobile echodensity adjacent to the right coronary leaflet in the LVOT.  Suspicious for vegetation in the setting of MSSA bacteremia though Lambls excrescence cannot be excluded.  Will discuss with cardiology.  If chances of a vegetation is less then we may not need 6 weeks of IV antibiotics. Unable to discharge patient home with a PICC line.    Polysubstance use.  Including IVDA  Atopic dermatitis   Discussed the  management with the patient.

## 2021-03-19 NOTE — Plan of Care (Signed)

## 2021-03-20 DIAGNOSIS — A4101 Sepsis due to Methicillin susceptible Staphylococcus aureus: Secondary | ICD-10-CM | POA: Diagnosis not present

## 2021-03-20 NOTE — Progress Notes (Signed)
PROGRESS NOTE    CYPHER PAULE  ZOX:[id number] DOB: [birth year] DOA: 03/11/2021 PCP: [name], MD     Brief Narrative:  Randy Henry is a 20 y.o. male with medical history significant for COVID-19 infection in January 2022, atopic eczema, ADHD, substance use disorder, asthma, who presented to the hospital because of multiple skin lesions on his upper extremities.   He was found to have severe sepsis secondary to MSSA bacteremia.  Left upper extremity cellulitis was suspected as well.  Blood culture revealed MSSA.  TEE was concerning for aortic valve vegetation.  ID recommends IV cefazolin for a total of 6 weeks.     He developed SVT during his stay in the hospital so he was started on oral Cardizem.  New events last 24 hours / Subjective: States he is very tired this morning.  No acute events overnight  Assessment & Plan:   Principal Problem:   Sepsis (HCC) Active Problems:   Bacteremia due to Staphylococcus aureus   SVT (supraventricular tachycardia) (HCC)   Severe sepsis secondary to MSSA bacteremia and ?left upper extremity cellulitis, suspected aortic valve endocarditis -TEE done on 03/16/2021 showed findings suspicious for vegetation on the aortic valve. -Continue IV cefazolin.  ID recommends total of 6 weeks of antibiotics. 9/15-10/26. Patient to remain inpatient for IV antibiotics due to hx of drug abuse   Atopic eczema, multiple scabbed lesions/rash on bilateral upper extremities, face, chest and back -Monkey pox viral DNA test was negative   SVT -Cardizem for rate control  ADHD -His mother said he does not take Evekeo at home and that he is medically nonadherent.   Substance use disorder -Patient said he uses cocaine and CBD Gummies.  He said he last used cocaine about a week prior to admission.  He said he does not use methadone but his cocaine may have been laced with methadone.  He said he does not use any IV drugs.  Mother said patient had "snorted"  prednisone and amphetamines on 03/10/21.  Reportedly, the prednisone was prescribed for the new lesions that he had developed on his upper extremities. -Repeat UDS 03/18/21 negative   DVT prophylaxis:  enoxaparin (LOVENOX) injection 40 mg Start: 03/11/21 2000 SCDs Start: 03/11/21 1939  Code Status:     Code Status Orders  (From admission, onward)           Start     Ordered   03/11/21 1939  Full code  Continuous        03/11/21 1938           Code Status History     This patient has a current code status but no historical code status.      Family Communication: No family at bedside Disposition Plan:  Status is: Inpatient  Remains inpatient appropriate because:Inpatient level of care appropriate due to severity of illness  Dispo:  Patient From: Home  Planned Disposition: Home  Medically stable for discharge: No.  Patient will remain in the hospital for IV antibiotic through 10/26      Antimicrobials:  Anti-infectives (From admission, onward)    Start     Dose/Rate Route Frequency Ordered Stop   03/16/21 1830  ceFAZolin (ANCEF) IVPB 2g/100 mL premix        2 g 200 mL/hr over 30 Minutes Intravenous Every 8 hours 03/16/21 1254     03/15/21 1045  valACYclovir (VALTREX) tablet 500 mg        500 mg Oral 2 times  daily 03/15/21 0945     03/12/21 1200  vancomycin (VANCOREADY) IVPB 1750 mg/350 mL  Status:  Discontinued        1,750 mg 175 mL/hr over 120 Minutes Intravenous Every 12 hours 03/12/21 0742 03/12/21 1006   03/12/21 1100  vancomycin (VANCOREADY) IVPB 1500 mg/300 mL  Status:  Discontinued        1,500 mg 150 mL/hr over 120 Minutes Intravenous Every 12 hours 03/11/21 2150 03/12/21 0742   03/12/21 1100  ceFAZolin (ANCEF) IVPB 2g/100 mL premix  Status:  Discontinued        2 g 200 mL/hr over 30 Minutes Intravenous Every 8 hours 03/12/21 1006 03/16/21 1254   03/12/21 0000  ceFEPIme (MAXIPIME) 2 g in sodium chloride 0.9 % 100 mL IVPB  Status:  Discontinued         2 g 200 mL/hr over 30 Minutes Intravenous Every 8 hours 03/11/21 2037 03/12/21 1006   03/11/21 2200  vancomycin (VANCOCIN) IVPB 1000 mg/200 mL premix        1,000 mg 200 mL/hr over 60 Minutes Intravenous  Once 03/11/21 2037 03/12/21 0154   03/11/21 1745  ceFEPIme (MAXIPIME) 2 g in sodium chloride 0.9 % 100 mL IVPB        2 g 200 mL/hr over 30 Minutes Intravenous  Once 03/11/21 1733 03/11/21 1831   03/11/21 1745  metroNIDAZOLE (FLAGYL) IVPB 500 mg        500 mg 100 mL/hr over 60 Minutes Intravenous  Once 03/11/21 1733 03/11/21 1942   03/11/21 1745  vancomycin (VANCOCIN) IVPB 1000 mg/200 mL premix        1,000 mg 200 mL/hr over 60 Minutes Intravenous  Once 03/11/21 1733 03/11/21 1907        Objective: Vitals:   03/19/21 1509 03/19/21 1924 03/20/21 0428 03/20/21 0857  BP: 138/82 (!) 147/73 (!) 143/83 139/81  Pulse: 96 82 81 85  Resp:  16 16 18   Temp: 98.7 F (37.1 C) 98.3 F (36.8 C) 98 F (36.7 C) 98.1 F (36.7 C)  TempSrc: Oral Oral Oral Oral  SpO2: 99% 99% 99% 99%  Weight:      Height:        Intake/Output Summary (Last 24 hours) at 03/20/2021 1017 Last data filed at 03/19/2021 2100 Gross per 24 hour  Intake 360 ml  Output 0 ml  Net 360 ml    Filed Weights   03/11/21 1804 03/11/21 2317 03/17/21 1312  Weight: 89.8 kg 82.5 kg 79.9 kg    Examination: General exam: Appears calm and comfortable     Data Reviewed: I have personally reviewed following labs and imaging studies  CBC: No results for input(s): WBC, NEUTROABS, HGB, HCT, MCV, PLT in the last 168 hours.  Basic Metabolic Panel: Recent Labs  Lab 03/18/21 1137  NA 140  K 3.7  CL 103  CO2 31  GLUCOSE 62*  BUN 11  CREATININE 0.90  CALCIUM 9.0    GFR: Estimated Creatinine Clearance: 139.4 mL/min (by C-G formula based on SCr of 0.9 mg/dL). Liver Function Tests: Recent Labs  Lab 03/18/21 1137  AST 28  ALT 33  ALKPHOS 69  BILITOT 0.4  PROT 7.1  ALBUMIN 4.0    No results for input(s):  LIPASE, AMYLASE in the last 168 hours. No results for input(s): AMMONIA in the last 168 hours. Coagulation Profile: Recent Labs  Lab 03/15/21 1543  INR 0.9    Cardiac Enzymes: No results for input(s): CKTOTAL, CKMB, CKMBINDEX, TROPONINI  in the last 168 hours. BNP (last 3 results) No results for input(s): PROBNP in the last 8760 hours. HbA1C: No results for input(s): HGBA1C in the last 72 hours. CBG: Recent Labs  Lab 03/16/21 1718 03/16/21 2035 03/17/21 0753 03/17/21 1215 03/17/21 1956  GLUCAP 99 139* 89 98 136*    Lipid Profile: No results for input(s): CHOL, HDL, LDLCALC, TRIG, CHOLHDL, LDLDIRECT in the last 72 hours. Thyroid Function Tests: No results for input(s): TSH, T4TOTAL, FREET4, T3FREE, THYROIDAB in the last 72 hours. Anemia Panel: No results for input(s): VITAMINB12, FOLATE, FERRITIN, TIBC, IRON, RETICCTPCT in the last 72 hours. Sepsis Labs: No results for input(s): PROCALCITON, LATICACIDVEN in the last 168 hours.   Recent Results (from the past 240 hour(s))  Monkeypox Virus DNA, Qualitative Real-Time PCR     Status: None   Collection Time: 03/11/21  5:46 PM   Specimen: Sterile Swab  Result Value Ref Range Status   Orthopoxvirus DNA, QL PCR NOT DETECTED NOT DETECTED Final    Comment: (NOTE) Non-variola Orthopoxvirus DNA not detected by real-time PCR. Performed At: [facility] [address] [place], [place] [id number] Jolene Schimke MD NW:[id number]   Blood culture (routine x 2)     Status: Abnormal   Collection Time: 03/11/21  5:46 PM   Specimen: BLOOD  Result Value Ref Range Status   Specimen Description   Final    BLOOD LEFT ANTECUBITAL Performed at [facility], [address], [place], [place] [address]    Special Requests   Final    BOTTLES DRAWN AEROBIC AND ANAEROBIC Blood Culture adequate volume Performed at [facility], [address] [place], [place] [address]    Culture  Setup Time   Final    GRAM  POSITIVE COCCI IN BOTH AEROBIC AND ANAEROBIC BOTTLES Organism ID to follow CRITICAL RESULT CALLED TO, READ BACK BY AND VERIFIED WITH: Algie Coffer, PHARMD AT 0929 ON 03/12/21 BY GM Performed at [facility], [address] [place], [place] [address]    Culture STAPHYLOCOCCUS AUREUS (A)  Final   Report Status 03/14/2021 FINAL  Final   Organism ID, Bacteria STAPHYLOCOCCUS AUREUS  Final      Susceptibility   Staphylococcus aureus - MIC*    CIPROFLOXACIN <=0.5 SENSITIVE Sensitive     ERYTHROMYCIN <=0.25 SENSITIVE Sensitive     GENTAMICIN <=0.5 SENSITIVE Sensitive     OXACILLIN 0.5 SENSITIVE Sensitive     TETRACYCLINE <=1 SENSITIVE Sensitive     VANCOMYCIN 1 SENSITIVE Sensitive     TRIMETH/SULFA <=10 SENSITIVE Sensitive     CLINDAMYCIN <=0.25 SENSITIVE Sensitive     RIFAMPIN <=0.5 SENSITIVE Sensitive     Inducible Clindamycin NEGATIVE Sensitive     * STAPHYLOCOCCUS AUREUS  Blood culture (routine x 2)     Status: Abnormal   Collection Time: 03/11/21  5:46 PM   Specimen: BLOOD  Result Value Ref Range Status   Specimen Description   Final    BLOOD RIGHT ANTECUBITAL Performed at [facility], [address], [place], [place] [address]    Special Requests   Final    BOTTLES DRAWN AEROBIC AND ANAEROBIC Blood Culture adequate volume Performed at [facility], [address] [place], [place] [address]    Culture  Setup Time   Final    GRAM POSITIVE COCCI ANAEROBIC BOTTLE ONLY CRITICAL RESULT CALLED TO, READ BACK BY AND VERIFIED WITH: CAROLINE CHILDS 03/14/21 1343 AMK Performed at [facility], [address] [place],  [place] [address]    Culture STAPHYLOCOCCUS AUREUS (A)  Final   Report Status 03/16/2021 FINAL  Final   Organism ID, Bacteria STAPHYLOCOCCUS AUREUS  Final      Susceptibility   Staphylococcus aureus - MIC*    CIPROFLOXACIN <=0.5 SENSITIVE Sensitive     ERYTHROMYCIN <=0.25 SENSITIVE Sensitive     GENTAMICIN <=0.5 SENSITIVE  Sensitive     OXACILLIN 0.5 SENSITIVE Sensitive     TETRACYCLINE <=1 SENSITIVE Sensitive     VANCOMYCIN 1 SENSITIVE Sensitive     TRIMETH/SULFA <=10 SENSITIVE Sensitive     CLINDAMYCIN <=0.25 SENSITIVE Sensitive     RIFAMPIN <=0.5 SENSITIVE Sensitive     Inducible Clindamycin NEGATIVE Sensitive     * STAPHYLOCOCCUS AUREUS  Aerobic Culture w Gram Stain (superficial specimen)     Status: None   Collection Time: 03/11/21  5:46 PM   Specimen: Arm  Result Value Ref Range Status   Specimen Description   Final    ARM Performed at [facility], [address], [place], [place] [address]    Special Requests   Final    NONE Performed at [facility], [address] [place], [place] [address]    Gram Stain NO WBC SEEN NO ORGANISMS SEEN   Final   Culture   Final    ABUNDANT STAPHYLOCOCCUS AUREUS ABUNDANT STREPTOCOCCUS GROUP C Beta hemolytic streptococci are predictably susceptible to penicillin and other beta lactams. Susceptibility testing not routinely performed. Performed at [facility], [address] [place], [place] [address]    Report Status 03/14/2021 FINAL  Final   Organism ID, Bacteria STAPHYLOCOCCUS AUREUS  Final      Susceptibility   Staphylococcus aureus - MIC*    CIPROFLOXACIN <=0.5 SENSITIVE Sensitive     ERYTHROMYCIN <=0.25 SENSITIVE Sensitive     GENTAMICIN <=0.5 SENSITIVE Sensitive     OXACILLIN 0.5 SENSITIVE Sensitive     TETRACYCLINE <=1 SENSITIVE Sensitive     VANCOMYCIN <=0.5 SENSITIVE Sensitive     TRIMETH/SULFA <=10 SENSITIVE Sensitive     CLINDAMYCIN <=0.25 SENSITIVE Sensitive     RIFAMPIN <=0.5 SENSITIVE Sensitive     Inducible Clindamycin NEGATIVE Sensitive     * ABUNDANT STAPHYLOCOCCUS AUREUS  Blood Culture ID Panel (Reflexed)     Status: Abnormal   Collection Time: 03/11/21  5:46 PM  Result Value Ref Range Status   Enterococcus faecalis NOT DETECTED NOT DETECTED Final   Enterococcus Faecium NOT DETECTED NOT DETECTED Final    Listeria monocytogenes NOT DETECTED NOT DETECTED Final   Staphylococcus species DETECTED (A) NOT DETECTED Final    Comment: CRITICAL RESULT CALLED TO, READ BACK BY AND VERIFIED WITH: Algie Coffer, PHARMD AT 0929 ON 03/12/21 BY GM    Staphylococcus aureus (BCID) DETECTED (A) NOT DETECTED Final    Comment: CRITICAL RESULT CALLED TO, READ BACK BY AND VERIFIED WITH: Algie Coffer, PHARMD AT 0929 ON 03/12/21 BY GM    Staphylococcus epidermidis NOT DETECTED NOT DETECTED Final   Staphylococcus lugdunensis NOT DETECTED NOT DETECTED Final   Streptococcus species NOT DETECTED NOT DETECTED Final   Streptococcus agalactiae NOT DETECTED NOT DETECTED Final   Streptococcus pneumoniae NOT DETECTED NOT DETECTED Final   Streptococcus pyogenes NOT DETECTED NOT DETECTED Final   A.calcoaceticus-baumannii NOT DETECTED NOT DETECTED Final   Bacteroides fragilis NOT DETECTED NOT DETECTED Final   Enterobacterales NOT DETECTED NOT DETECTED Final   Enterobacter cloacae complex NOT DETECTED NOT DETECTED Final   Escherichia coli NOT DETECTED NOT  DETECTED Final   Klebsiella aerogenes NOT DETECTED NOT DETECTED Final   Klebsiella oxytoca NOT DETECTED NOT DETECTED Final   Klebsiella pneumoniae NOT DETECTED NOT DETECTED Final   Proteus species NOT DETECTED NOT DETECTED Final   Salmonella species NOT DETECTED NOT DETECTED Final   Serratia marcescens NOT DETECTED NOT DETECTED Final   Haemophilus influenzae NOT DETECTED NOT DETECTED Final   Neisseria meningitidis NOT DETECTED NOT DETECTED Final   Pseudomonas aeruginosa NOT DETECTED NOT DETECTED Final   Stenotrophomonas maltophilia NOT DETECTED NOT DETECTED Final   Candida albicans NOT DETECTED NOT DETECTED Final   Candida auris NOT DETECTED NOT DETECTED Final   Candida glabrata NOT DETECTED NOT DETECTED Final   Candida krusei NOT DETECTED NOT DETECTED Final   Candida parapsilosis NOT DETECTED NOT DETECTED Final   Candida tropicalis NOT DETECTED NOT DETECTED Final    Cryptococcus neoformans/gattii NOT DETECTED NOT DETECTED Final   Meth resistant mecA/C and MREJ NOT DETECTED NOT DETECTED Final    Comment: Performed at [facility], [address] [place], [place] [address]  MRSA Next Gen by PCR, Nasal     Status: None   Collection Time: 03/12/21  1:03 AM   Specimen: Nasal Mucosa; Nasal Swab  Result Value Ref Range Status   MRSA by PCR Next Gen NOT DETECTED NOT DETECTED Final    Comment: (NOTE) The GeneXpert MRSA Assay (FDA approved for NASAL specimens only), is one component of a comprehensive MRSA colonization surveillance program. It is not intended to diagnose MRSA infection nor to guide or monitor treatment for MRSA infections. Test performance is not FDA approved in patients less than 12 years old. Performed at [facility], [address] [place], [place] [address]   Chlamydia/NGC rt PCR [facility] only)     Status: None   Collection Time: 03/12/21  1:55 AM   Specimen: Urine  Result Value Ref Range Status   Specimen source GC/Chlam URINE, RANDOM  Final   Chlamydia Tr NOT DETECTED NOT DETECTED Final   N gonorrhoeae NOT DETECTED NOT DETECTED Final    Comment: (NOTE) This CT/NG assay has not been evaluated in patients with a history of  hysterectomy. Performed at [facility], [address] [place], [place] [address]   CULTURE, BLOOD (ROUTINE X 2) w Reflex to ID Panel     Status: None   Collection Time: 03/13/21 12:11 AM   Specimen: BLOOD  Result Value Ref Range Status   Specimen Description BLOOD BLOOD LEFT FOREARM  Final   Special Requests   Final    BOTTLES DRAWN AEROBIC AND ANAEROBIC Blood Culture adequate volume   Culture   Final    NO GROWTH 5 DAYS Performed at [facility], [address] [place], [place] [address]    Report Status 03/18/2021 FINAL  Final  CULTURE, BLOOD (ROUTINE X 2) w Reflex to ID Panel     Status: None   Collection Time: 03/13/21 12:11 AM   Specimen: BLOOD  Result  Value Ref Range Status   Specimen Description BLOOD BLOOD LEFT HAND  Final   Special Requests   Final    BOTTLES DRAWN AEROBIC AND ANAEROBIC Blood Culture adequate volume   Culture   Final    NO GROWTH 5 DAYS Performed at [facility], [address], [place], [place] [address]    Report Status 03/18/2021 FINAL  Final       Radiology Studies: No results found.    Scheduled Meds:  diltiazem  120 mg Oral Daily   enoxaparin (LOVENOX) injection  40 mg Subcutaneous Q24H   feeding supplement  237 mL Oral TID BM   hydrocerin   Topical BID   sertraline  50 mg Oral Daily   triamcinolone ointment   Topical TID   valACYclovir  500 mg Oral BID   Continuous Infusions:  sodium chloride 10 mL/hr at 03/16/21 0739    ceFAZolin (ANCEF) IV 2 g (03/20/21 0904)     LOS: 9 days      Time spent: 15 minutes   Noralee Stain, DO [facility] 03/20/2021, 10:17 AM   Available via Epic secure chat 7am-7pm After these hours, please refer to coverage provider listed on [email or web address]

## 2021-03-21 DIAGNOSIS — A4101 Sepsis due to Methicillin susceptible Staphylococcus aureus: Secondary | ICD-10-CM | POA: Diagnosis not present

## 2021-03-21 NOTE — Progress Notes (Signed)
PROGRESS NOTE    Randy Henry  XFG:[id number] DOB: 17-Nov-2000 DOA: 03/11/2021 PCP: [name], MD     Brief Narrative:  Randy Henry is a 20 y.o. male with medical history significant for COVID-19 infection in January 2022, atopic eczema, ADHD, substance use disorder, asthma, who presented to the hospital because of multiple skin lesions on his upper extremities.   He was found to have severe sepsis secondary to MSSA bacteremia.  Left upper extremity cellulitis was suspected as well.  Blood culture revealed MSSA.  TEE was concerning for aortic valve vegetation.  ID recommends IV cefazolin for a total of 6 weeks.     He developed SVT during his stay in the hospital so he was started on oral Cardizem.  New events last 24 hours / Subjective: No new complaints.   Assessment & Plan:   Principal Problem:   Sepsis (HCC) Active Problems:   Bacteremia due to Staphylococcus aureus   SVT (supraventricular tachycardia) (HCC)   Severe sepsis secondary to MSSA bacteremia and ?left upper extremity cellulitis, suspected aortic valve endocarditis -TEE done on 03/16/2021 showed findings suspicious for vegetation on the aortic valve. -Continue IV cefazolin.  ID recommends total of 6 weeks of antibiotics. 9/15-10/26. Patient to remain inpatient for IV antibiotics due to hx of drug abuse   Atopic eczema, multiple scabbed lesions/rash on bilateral upper extremities, face, chest and back -Monkey pox viral DNA test was negative   SVT -Cardizem for rate control  ADHD -His mother said he does not take Evekeo at home and that he is medically nonadherent.   Substance use disorder -Patient said he uses cocaine and CBD Gummies.  He said he last used cocaine about a week prior to admission.  He said he does not use methadone but his cocaine may have been laced with methadone.  He said he does not use any IV drugs.  Mother said patient had "snorted" prednisone and amphetamines on 03/10/21.  Reportedly,  the prednisone was prescribed for the new lesions that he had developed on his upper extremities. -Repeat UDS 03/18/21 negative   DVT prophylaxis:  enoxaparin (LOVENOX) injection 40 mg Start: 03/11/21 2000 SCDs Start: 03/11/21 1939  Code Status:     Code Status Orders  (From admission, onward)           Start     Ordered   03/11/21 1939  Full code  Continuous        03/11/21 1938           Code Status History     This patient has a current code status but no historical code status.      Family Communication: No family at bedside Disposition Plan:  Status is: Inpatient  Remains inpatient appropriate because:Inpatient level of care appropriate due to severity of illness  Dispo:  Patient From: Home  Planned Disposition: Home  Medically stable for discharge: No.  Patient will remain in the hospital for IV antibiotic through 10/26      Antimicrobials:  Anti-infectives (From admission, onward)    Start     Dose/Rate Route Frequency Ordered Stop   03/16/21 1830  ceFAZolin (ANCEF) IVPB 2g/100 mL premix        2 g 200 mL/hr over 30 Minutes Intravenous Every 8 hours 03/16/21 1254     03/15/21 1045  valACYclovir (VALTREX) tablet 500 mg        500 mg Oral 2 times daily 03/15/21 0945     03/12/21  1200  vancomycin (VANCOREADY) IVPB 1750 mg/350 mL  Status:  Discontinued        1,750 mg 175 mL/hr over 120 Minutes Intravenous Every 12 hours 03/12/21 0742 03/12/21 1006   03/12/21 1100  vancomycin (VANCOREADY) IVPB 1500 mg/300 mL  Status:  Discontinued        1,500 mg 150 mL/hr over 120 Minutes Intravenous Every 12 hours 03/11/21 2150 03/12/21 0742   03/12/21 1100  ceFAZolin (ANCEF) IVPB 2g/100 mL premix  Status:  Discontinued        2 g 200 mL/hr over 30 Minutes Intravenous Every 8 hours 03/12/21 1006 03/16/21 1254   03/12/21 0000  ceFEPIme (MAXIPIME) 2 g in sodium chloride 0.9 % 100 mL IVPB  Status:  Discontinued        2 g 200 mL/hr over 30 Minutes Intravenous Every 8  hours 03/11/21 2037 03/12/21 1006   03/11/21 2200  vancomycin (VANCOCIN) IVPB 1000 mg/200 mL premix        1,000 mg 200 mL/hr over 60 Minutes Intravenous  Once 03/11/21 2037 03/12/21 0154   03/11/21 1745  ceFEPIme (MAXIPIME) 2 g in sodium chloride 0.9 % 100 mL IVPB        2 g 200 mL/hr over 30 Minutes Intravenous  Once 03/11/21 1733 03/11/21 1831   03/11/21 1745  metroNIDAZOLE (FLAGYL) IVPB 500 mg        500 mg 100 mL/hr over 60 Minutes Intravenous  Once 03/11/21 1733 03/11/21 1942   03/11/21 1745  vancomycin (VANCOCIN) IVPB 1000 mg/200 mL premix        1,000 mg 200 mL/hr over 60 Minutes Intravenous  Once 03/11/21 1733 03/11/21 1907        Objective: Vitals:   03/20/21 1549 03/20/21 1949 03/21/21 0440 03/21/21 0807  BP: 139/74 138/84 (!) 151/94 137/86  Pulse: 87 93 (!) 101 97  Resp: 19 16 16 16   Temp: 98.2 F (36.8 C) 98.2 F (36.8 C) 98.1 F (36.7 C) 97.6 F (36.4 C)  TempSrc:  Oral Oral Oral  SpO2: 97% 98% 98% 97%  Weight:      Height:        Intake/Output Summary (Last 24 hours) at 03/21/2021 1030 Last data filed at 03/21/2021 03/23/2021 Gross per 24 hour  Intake 600 ml  Output --  Net 600 ml    Filed Weights   03/11/21 1804 03/11/21 2317 03/17/21 1312  Weight: 89.8 kg 82.5 kg 79.9 kg   Examination: General exam: Appears calm and comfortable   Data Reviewed: I have personally reviewed following labs and imaging studies  CBC: No results for input(s): WBC, NEUTROABS, HGB, HCT, MCV, PLT in the last 168 hours.  Basic Metabolic Panel: Recent Labs  Lab 03/18/21 1137  NA 140  K 3.7  CL 103  CO2 31  GLUCOSE 62*  BUN 11  CREATININE 0.90  CALCIUM 9.0    GFR: Estimated Creatinine Clearance: 139.4 mL/min (by C-G formula based on SCr of 0.9 mg/dL). Liver Function Tests: Recent Labs  Lab 03/18/21 1137  AST 28  ALT 33  ALKPHOS 69  BILITOT 0.4  PROT 7.1  ALBUMIN 4.0    No results for input(s): LIPASE, AMYLASE in the last 168 hours. No results for  input(s): AMMONIA in the last 168 hours. Coagulation Profile: Recent Labs  Lab 03/15/21 1543  INR 0.9    Cardiac Enzymes: No results for input(s): CKTOTAL, CKMB, CKMBINDEX, TROPONINI in the last 168 hours. BNP (last 3 results) No results for  input(s): PROBNP in the last 8760 hours. HbA1C: No results for input(s): HGBA1C in the last 72 hours. CBG: Recent Labs  Lab 03/16/21 1718 03/16/21 2035 03/17/21 0753 03/17/21 1215 03/17/21 1956  GLUCAP 99 139* 89 98 136*    Lipid Profile: No results for input(s): CHOL, HDL, LDLCALC, TRIG, CHOLHDL, LDLDIRECT in the last 72 hours. Thyroid Function Tests: No results for input(s): TSH, T4TOTAL, FREET4, T3FREE, THYROIDAB in the last 72 hours. Anemia Panel: No results for input(s): VITAMINB12, FOLATE, FERRITIN, TIBC, IRON, RETICCTPCT in the last 72 hours. Sepsis Labs: No results for input(s): PROCALCITON, LATICACIDVEN in the last 168 hours.   Recent Results (from the past 240 hour(s))  Monkeypox Virus DNA, Qualitative Real-Time PCR     Status: None   Collection Time: 03/11/21  5:46 PM   Specimen: Sterile Swab  Result Value Ref Range Status   Orthopoxvirus DNA, QL PCR NOT DETECTED NOT DETECTED Final    Comment: (NOTE) Non-variola Orthopoxvirus DNA not detected by real-time PCR. Performed At: [facility] [address] [place], [place] [id number] Jolene Schimke MD BW:[id number]   Blood culture (routine x 2)     Status: Abnormal   Collection Time: 03/11/21  5:46 PM   Specimen: BLOOD  Result Value Ref Range Status   Specimen Description   Final    BLOOD LEFT ANTECUBITAL Performed at [facility], [address], [place], [place] [address]    Special Requests   Final    BOTTLES DRAWN AEROBIC AND ANAEROBIC Blood Culture adequate volume Performed at [facility], [address] [place], [place] [address]    Culture  Setup Time   Final    GRAM POSITIVE COCCI IN BOTH AEROBIC AND ANAEROBIC  BOTTLES Organism ID to follow CRITICAL RESULT CALLED TO, READ BACK BY AND VERIFIED WITH: Algie Coffer, PHARMD AT 0929 ON 03/12/21 BY GM Performed at [facility], [address] [place], [place] [address]    Culture STAPHYLOCOCCUS AUREUS (A)  Final   Report Status 03/14/2021 FINAL  Final   Organism ID, Bacteria STAPHYLOCOCCUS AUREUS  Final      Susceptibility   Staphylococcus aureus - MIC*    CIPROFLOXACIN <=0.5 SENSITIVE Sensitive     ERYTHROMYCIN <=0.25 SENSITIVE Sensitive     GENTAMICIN <=0.5 SENSITIVE Sensitive     OXACILLIN 0.5 SENSITIVE Sensitive     TETRACYCLINE <=1 SENSITIVE Sensitive     VANCOMYCIN 1 SENSITIVE Sensitive     TRIMETH/SULFA <=10 SENSITIVE Sensitive     CLINDAMYCIN <=0.25 SENSITIVE Sensitive     RIFAMPIN <=0.5 SENSITIVE Sensitive     Inducible Clindamycin NEGATIVE Sensitive     * STAPHYLOCOCCUS AUREUS  Blood culture (routine x 2)     Status: Abnormal   Collection Time: 03/11/21  5:46 PM   Specimen: BLOOD  Result Value Ref Range Status   Specimen Description   Final    BLOOD RIGHT ANTECUBITAL Performed at [facility], [address], [place], [place] [address]    Special Requests   Final    BOTTLES DRAWN AEROBIC AND ANAEROBIC Blood Culture adequate volume Performed at [facility], [address] [place], [place] [address]    Culture  Setup Time   Final    GRAM POSITIVE COCCI ANAEROBIC BOTTLE ONLY CRITICAL RESULT CALLED TO, READ BACK BY AND VERIFIED WITH: CAROLINE CHILDS 03/14/21 1343 AMK Performed at [facility], [address] [place], [place] [address]    Culture STAPHYLOCOCCUS AUREUS (A)  Final  Report Status 03/16/2021 FINAL  Final   Organism ID, Bacteria STAPHYLOCOCCUS AUREUS  Final      Susceptibility   Staphylococcus aureus - MIC*    CIPROFLOXACIN <=0.5 SENSITIVE Sensitive     ERYTHROMYCIN <=0.25 SENSITIVE Sensitive     GENTAMICIN <=0.5 SENSITIVE Sensitive     OXACILLIN 0.5 SENSITIVE Sensitive      TETRACYCLINE <=1 SENSITIVE Sensitive     VANCOMYCIN 1 SENSITIVE Sensitive     TRIMETH/SULFA <=10 SENSITIVE Sensitive     CLINDAMYCIN <=0.25 SENSITIVE Sensitive     RIFAMPIN <=0.5 SENSITIVE Sensitive     Inducible Clindamycin NEGATIVE Sensitive     * STAPHYLOCOCCUS AUREUS  Aerobic Culture w Gram Stain (superficial specimen)     Status: None   Collection Time: 03/11/21  5:46 PM   Specimen: Arm  Result Value Ref Range Status   Specimen Description   Final    ARM Performed at [facility], [address] [place], [place] [address]    Special Requests   Final    NONE Performed at [facility], [address] [place], [place] [address]    Gram Stain NO WBC SEEN NO ORGANISMS SEEN   Final   Culture   Final    ABUNDANT STAPHYLOCOCCUS AUREUS ABUNDANT STREPTOCOCCUS GROUP C Beta hemolytic streptococci are predictably susceptible to penicillin and other beta lactams. Susceptibility testing not routinely performed. Performed at [facility], [address] [place], [place] [address]    Report Status 03/14/2021 FINAL  Final   Organism ID, Bacteria STAPHYLOCOCCUS AUREUS  Final      Susceptibility   Staphylococcus aureus - MIC*    CIPROFLOXACIN <=0.5 SENSITIVE Sensitive     ERYTHROMYCIN <=0.25 SENSITIVE Sensitive     GENTAMICIN <=0.5 SENSITIVE Sensitive     OXACILLIN 0.5 SENSITIVE Sensitive     TETRACYCLINE <=1 SENSITIVE Sensitive     VANCOMYCIN <=0.5 SENSITIVE Sensitive     TRIMETH/SULFA <=10 SENSITIVE Sensitive     CLINDAMYCIN <=0.25 SENSITIVE Sensitive     RIFAMPIN <=0.5 SENSITIVE Sensitive     Inducible Clindamycin NEGATIVE Sensitive     * ABUNDANT STAPHYLOCOCCUS AUREUS  Blood Culture ID Panel (Reflexed)     Status: Abnormal   Collection Time: 03/11/21  5:46 PM  Result Value Ref Range Status   Enterococcus faecalis NOT DETECTED NOT DETECTED Final   Enterococcus Faecium NOT DETECTED NOT DETECTED Final   Listeria monocytogenes NOT DETECTED NOT DETECTED  Final   Staphylococcus species DETECTED (A) NOT DETECTED Final    Comment: CRITICAL RESULT CALLED TO, READ BACK BY AND VERIFIED WITH: Algie Coffer, PHARMD AT 0929 ON 03/12/21 BY GM    Staphylococcus aureus (BCID) DETECTED (A) NOT DETECTED Final    Comment: CRITICAL RESULT CALLED TO, READ BACK BY AND VERIFIED WITH: Algie Coffer, PHARMD AT 0929 ON 03/12/21 BY GM    Staphylococcus epidermidis NOT DETECTED NOT DETECTED Final   Staphylococcus lugdunensis NOT DETECTED NOT DETECTED Final   Streptococcus species NOT DETECTED NOT DETECTED Final   Streptococcus agalactiae NOT DETECTED NOT DETECTED Final   Streptococcus pneumoniae NOT DETECTED NOT DETECTED Final   Streptococcus pyogenes NOT DETECTED NOT DETECTED Final   A.calcoaceticus-baumannii NOT DETECTED NOT DETECTED Final   Bacteroides fragilis NOT DETECTED NOT DETECTED Final   Enterobacterales NOT DETECTED NOT DETECTED Final   Enterobacter cloacae complex NOT DETECTED NOT DETECTED Final   Escherichia coli NOT DETECTED NOT DETECTED Final   Klebsiella aerogenes NOT DETECTED NOT DETECTED Final  Klebsiella oxytoca NOT DETECTED NOT DETECTED Final   Klebsiella pneumoniae NOT DETECTED NOT DETECTED Final   Proteus species NOT DETECTED NOT DETECTED Final   Salmonella species NOT DETECTED NOT DETECTED Final   Serratia marcescens NOT DETECTED NOT DETECTED Final   Haemophilus influenzae NOT DETECTED NOT DETECTED Final   Neisseria meningitidis NOT DETECTED NOT DETECTED Final   Pseudomonas aeruginosa NOT DETECTED NOT DETECTED Final   Stenotrophomonas maltophilia NOT DETECTED NOT DETECTED Final   Candida albicans NOT DETECTED NOT DETECTED Final   Candida auris NOT DETECTED NOT DETECTED Final   Candida glabrata NOT DETECTED NOT DETECTED Final   Candida krusei NOT DETECTED NOT DETECTED Final   Candida parapsilosis NOT DETECTED NOT DETECTED Final   Candida tropicalis NOT DETECTED NOT DETECTED Final   Cryptococcus neoformans/gattii NOT DETECTED NOT  DETECTED Final   Meth resistant mecA/C and MREJ NOT DETECTED NOT DETECTED Final    Comment: Performed at [facility], [address] [place], [place] [address]  MRSA Next Gen by PCR, Nasal     Status: None   Collection Time: 03/12/21  1:03 AM   Specimen: Nasal Mucosa; Nasal Swab  Result Value Ref Range Status   MRSA by PCR Next Gen NOT DETECTED NOT DETECTED Final    Comment: (NOTE) The GeneXpert MRSA Assay (FDA approved for NASAL specimens only), is one component of a comprehensive MRSA colonization surveillance program. It is not intended to diagnose MRSA infection nor to guide or monitor treatment for MRSA infections. Test performance is not FDA approved in patients less than 2 years old. Performed at [facility], [address] [place], [place] [address]   Chlamydia/NGC rt PCR [facility] only)     Status: None   Collection Time: 03/12/21  1:55 AM   Specimen: Urine  Result Value Ref Range Status   Specimen source GC/Chlam URINE, RANDOM  Final   Chlamydia Tr NOT DETECTED NOT DETECTED Final   N gonorrhoeae NOT DETECTED NOT DETECTED Final    Comment: (NOTE) This CT/NG assay has not been evaluated in patients with a history of  hysterectomy. Performed at [facility], [address] [place], [place] [address]   CULTURE, BLOOD (ROUTINE X 2) w Reflex to ID Panel     Status: None   Collection Time: 03/13/21 12:11 AM   Specimen: BLOOD  Result Value Ref Range Status   Specimen Description BLOOD BLOOD LEFT FOREARM  Final   Special Requests   Final    BOTTLES DRAWN AEROBIC AND ANAEROBIC Blood Culture adequate volume   Culture   Final    NO GROWTH 5 DAYS Performed at [facility], [address] [place], [place] [address]    Report Status 03/18/2021 FINAL  Final  CULTURE, BLOOD (ROUTINE X 2) w Reflex to ID Panel     Status: None   Collection Time: 03/13/21 12:11 AM   Specimen: BLOOD  Result Value Ref Range Status   Specimen Description  BLOOD BLOOD LEFT HAND  Final   Special Requests   Final    BOTTLES DRAWN AEROBIC AND ANAEROBIC Blood Culture adequate volume   Culture   Final    NO GROWTH 5 DAYS Performed at [facility], [address], [place], [place] [address]    Report Status 03/18/2021 FINAL  Final       Radiology Studies: No results found.    Scheduled Meds:  diltiazem  120 mg Oral Daily   enoxaparin (LOVENOX) injection  40  mg Subcutaneous Q24H   feeding supplement  237 mL Oral TID BM   hydrocerin   Topical BID   sertraline  50 mg Oral Daily   triamcinolone ointment   Topical TID   valACYclovir  500 mg Oral BID   Continuous Infusions:  sodium chloride 10 mL/hr at 03/16/21 0739    ceFAZolin (ANCEF) IV 2 g (03/21/21 0930)     LOS: 10 days      Time spent: 15 minutes   Noralee Stain, DO [facility] 03/21/2021, 10:30 AM   Available via Epic secure chat 7am-7pm After these hours, please refer to coverage provider listed on [email or web address]

## 2021-03-22 DIAGNOSIS — A4101 Sepsis due to Methicillin susceptible Staphylococcus aureus: Secondary | ICD-10-CM | POA: Diagnosis not present

## 2021-03-22 MED ORDER — RISAQUAD PO CAPS
1.0000 | ORAL_CAPSULE | Freq: Every day | ORAL | Status: DC
  Administered 2021-03-22 – 2021-03-25 (×4): 1 via ORAL
  Filled 2021-03-22 (×4): qty 1

## 2021-03-22 NOTE — Progress Notes (Signed)
PROGRESS NOTE    Randy Henry  DPO:[id number] DOB: Feb 08, 2001 DOA: 03/11/2021 PCP: [name], MD     Brief Narrative:  Randy Henry is a 20 y.o. male with medical history significant for COVID-19 infection in January 2022, atopic eczema, ADHD, substance use disorder, asthma, who presented to the hospital because of multiple skin lesions on his upper extremities.   He was found to have severe sepsis secondary to MSSA bacteremia.  Left upper extremity cellulitis was suspected as well.  Blood culture revealed MSSA.  TEE was concerning for aortic valve vegetation.  ID recommends IV cefazolin for a total of 6 weeks.     He developed SVT during his stay in the hospital so he was started on oral Cardizem.  New events last 24 hours / Subjective: No new issues overnight.  Resting comfortably in bed  Assessment & Plan:   Principal Problem:   Sepsis (HCC) Active Problems:   Bacteremia due to Staphylococcus aureus   SVT (supraventricular tachycardia) (HCC)   Severe sepsis secondary to MSSA bacteremia and ?left upper extremity cellulitis, suspected aortic valve endocarditis -TEE done on 03/16/2021 showed findings suspicious for vegetation on the aortic valve. -Continue IV cefazolin.  ID recommends total of 6 weeks of antibiotics. 9/15-10/26. Patient to remain inpatient for IV antibiotics due to hx of drug abuse   Atopic eczema, multiple scabbed lesions/rash on bilateral upper extremities, face, chest and back -Monkey pox viral DNA test was negative   SVT -Cardizem for rate control  ADHD -His mother said he does not take Evekeo at home and that he is medically nonadherent.   Substance use disorder -Patient said he uses cocaine and CBD Gummies.  He said he last used cocaine about a week prior to admission.  He said he does not use methadone but his cocaine may have been laced with methadone.  He said he does not use any IV drugs.  Mother said patient had "snorted" prednisone and  amphetamines on 03/10/21.  Reportedly, the prednisone was prescribed for the new lesions that he had developed on his upper extremities. -Repeat UDS 03/18/21 negative   DVT prophylaxis:  enoxaparin (LOVENOX) injection 40 mg Start: 03/11/21 2000 SCDs Start: 03/11/21 1939  Code Status:     Code Status Orders  (From admission, onward)           Start     Ordered   03/11/21 1939  Full code  Continuous        03/11/21 1938           Code Status History     This patient has a current code status but no historical code status.      Family Communication: No family at bedside Disposition Plan:  Status is: Inpatient  Remains inpatient appropriate because:Inpatient level of care appropriate due to severity of illness  Dispo:  Patient From: Home  Planned Disposition: Home  Medically stable for discharge: No.  Patient will remain in the hospital for IV antibiotic through 10/26      Antimicrobials:  Anti-infectives (From admission, onward)    Start     Dose/Rate Route Frequency Ordered Stop   03/16/21 1830  ceFAZolin (ANCEF) IVPB 2g/100 mL premix        2 g 200 mL/hr over 30 Minutes Intravenous Every 8 hours 03/16/21 1254     03/15/21 1045  valACYclovir (VALTREX) tablet 500 mg        500 mg Oral 2 times daily 03/15/21 0945  03/12/21 1200  vancomycin (VANCOREADY) IVPB 1750 mg/350 mL  Status:  Discontinued        1,750 mg 175 mL/hr over 120 Minutes Intravenous Every 12 hours 03/12/21 0742 03/12/21 1006   03/12/21 1100  vancomycin (VANCOREADY) IVPB 1500 mg/300 mL  Status:  Discontinued        1,500 mg 150 mL/hr over 120 Minutes Intravenous Every 12 hours 03/11/21 2150 03/12/21 0742   03/12/21 1100  ceFAZolin (ANCEF) IVPB 2g/100 mL premix  Status:  Discontinued        2 g 200 mL/hr over 30 Minutes Intravenous Every 8 hours 03/12/21 1006 03/16/21 1254   03/12/21 0000  ceFEPIme (MAXIPIME) 2 g in sodium chloride 0.9 % 100 mL IVPB  Status:  Discontinued        2 g 200  mL/hr over 30 Minutes Intravenous Every 8 hours 03/11/21 2037 03/12/21 1006   03/11/21 2200  vancomycin (VANCOCIN) IVPB 1000 mg/200 mL premix        1,000 mg 200 mL/hr over 60 Minutes Intravenous  Once 03/11/21 2037 03/12/21 0154   03/11/21 1745  ceFEPIme (MAXIPIME) 2 g in sodium chloride 0.9 % 100 mL IVPB        2 g 200 mL/hr over 30 Minutes Intravenous  Once 03/11/21 1733 03/11/21 1831   03/11/21 1745  metroNIDAZOLE (FLAGYL) IVPB 500 mg        500 mg 100 mL/hr over 60 Minutes Intravenous  Once 03/11/21 1733 03/11/21 1942   03/11/21 1745  vancomycin (VANCOCIN) IVPB 1000 mg/200 mL premix        1,000 mg 200 mL/hr over 60 Minutes Intravenous  Once 03/11/21 1733 03/11/21 1907        Objective: Vitals:   03/21/21 1539 03/21/21 2145 03/22/21 0532 03/22/21 0802  BP: (!) 142/80 (!) 148/86 136/82 138/69  Pulse: 97 100 99 82  Resp: 20  18 18   Temp: 98.4 F (36.9 C) 98.7 F (37.1 C) 98.6 F (37 C) 98.2 F (36.8 C)  TempSrc: Oral Oral Oral   SpO2: 99% 98% 97% 98%  Weight:      Height:        Intake/Output Summary (Last 24 hours) at 03/22/2021 1033 Last data filed at 03/22/2021 1008 Gross per 24 hour  Intake 720 ml  Output --  Net 720 ml    Filed Weights   03/11/21 1804 03/11/21 2317 03/17/21 1312  Weight: 89.8 kg 82.5 kg 79.9 kg   Examination: General exam: Appears calm and comfortable   Data Reviewed: I have personally reviewed following labs and imaging studies  CBC: No results for input(s): WBC, NEUTROABS, HGB, HCT, MCV, PLT in the last 168 hours.  Basic Metabolic Panel: Recent Labs  Lab 03/18/21 1137  NA 140  K 3.7  CL 103  CO2 31  GLUCOSE 62*  BUN 11  CREATININE 0.90  CALCIUM 9.0    GFR: Estimated Creatinine Clearance: 139.4 mL/min (by C-G formula based on SCr of 0.9 mg/dL). Liver Function Tests: Recent Labs  Lab 03/18/21 1137  AST 28  ALT 33  ALKPHOS 69  BILITOT 0.4  PROT 7.1  ALBUMIN 4.0    No results for input(s): LIPASE, AMYLASE in the  last 168 hours. No results for input(s): AMMONIA in the last 168 hours. Coagulation Profile: Recent Labs  Lab 03/15/21 1543  INR 0.9    Cardiac Enzymes: No results for input(s): CKTOTAL, CKMB, CKMBINDEX, TROPONINI in the last 168 hours. BNP (last 3 results) No results  for input(s): PROBNP in the last 8760 hours. HbA1C: No results for input(s): HGBA1C in the last 72 hours. CBG: Recent Labs  Lab 03/16/21 1718 03/16/21 2035 03/17/21 0753 03/17/21 1215 03/17/21 1956  GLUCAP 99 139* 89 98 136*    Lipid Profile: No results for input(s): CHOL, HDL, LDLCALC, TRIG, CHOLHDL, LDLDIRECT in the last 72 hours. Thyroid Function Tests: No results for input(s): TSH, T4TOTAL, FREET4, T3FREE, THYROIDAB in the last 72 hours. Anemia Panel: No results for input(s): VITAMINB12, FOLATE, FERRITIN, TIBC, IRON, RETICCTPCT in the last 72 hours. Sepsis Labs: No results for input(s): PROCALCITON, LATICACIDVEN in the last 168 hours.   Recent Results (from the past 240 hour(s))  CULTURE, BLOOD (ROUTINE X 2) w Reflex to ID Panel     Status: None   Collection Time: 03/13/21 12:11 AM   Specimen: BLOOD  Result Value Ref Range Status   Specimen Description BLOOD BLOOD LEFT FOREARM  Final   Special Requests   Final    BOTTLES DRAWN AEROBIC AND ANAEROBIC Blood Culture adequate volume   Culture   Final    NO GROWTH 5 DAYS Performed at [facility], [address] [place], [place] [address]    Report Status 03/18/2021 FINAL  Final  CULTURE, BLOOD (ROUTINE X 2) w Reflex to ID Panel     Status: None   Collection Time: 03/13/21 12:11 AM   Specimen: BLOOD  Result Value Ref Range Status   Specimen Description BLOOD BLOOD LEFT HAND  Final   Special Requests   Final    BOTTLES DRAWN AEROBIC AND ANAEROBIC Blood Culture adequate volume   Culture   Final    NO GROWTH 5 DAYS Performed at [facility], [address], [place], [place] [address]    Report Status 03/18/2021 FINAL  Final        Radiology Studies: No results found.    Scheduled Meds:  acidophilus  1 capsule Oral Daily   diltiazem  120 mg Oral Daily   enoxaparin (LOVENOX) injection  40 mg Subcutaneous Q24H   feeding supplement  237 mL Oral TID BM   hydrocerin   Topical BID   sertraline  50 mg Oral Daily   triamcinolone ointment   Topical TID   valACYclovir  500 mg Oral BID   Continuous Infusions:  sodium chloride 10 mL/hr at 03/16/21 0739    ceFAZolin (ANCEF) IV 2 g (03/22/21 0234)     LOS: 11 days      Time spent: 15 minutes   Noralee Stain, DO [facility] 03/22/2021, 10:33 AM   Available via Epic secure chat 7am-7pm After these hours, please refer to coverage provider listed on [email or web address]

## 2021-03-23 DIAGNOSIS — A4101 Sepsis due to Methicillin susceptible Staphylococcus aureus: Secondary | ICD-10-CM | POA: Diagnosis not present

## 2021-03-23 DIAGNOSIS — R7881 Bacteremia: Secondary | ICD-10-CM | POA: Diagnosis not present

## 2021-03-23 DIAGNOSIS — B9561 Methicillin susceptible Staphylococcus aureus infection as the cause of diseases classified elsewhere: Secondary | ICD-10-CM | POA: Diagnosis not present

## 2021-03-23 NOTE — Progress Notes (Signed)
PROGRESS NOTE    Randy Henry  YSA:[id number] DOB: 07-01-2000 DOA: 03/11/2021 PCP: [name], MD     Brief Narrative:  Randy Henry is a 20 y.o. male with medical history significant for COVID-19 infection in January 2022, atopic eczema, ADHD, substance use disorder, asthma, who presented to the hospital because of multiple skin lesions on his upper extremities.   He was found to have severe sepsis secondary to MSSA bacteremia.  Left upper extremity cellulitis was suspected as well.  Blood culture revealed MSSA.  TEE was concerning for aortic valve vegetation.  ID recommends IV cefazolin for a total of 6 weeks.     He developed SVT during his stay in the hospital so he was started on oral Cardizem.  New events last 24 hours / Subjective: Wants to be disconnected from IV when not in use as he wants to be able to move around bed and not have IV beeping. I let RN know.   Assessment & Plan:   Principal Problem:   Sepsis (HCC) Active Problems:   Bacteremia due to Staphylococcus aureus   SVT (supraventricular tachycardia) (HCC)   Severe sepsis secondary to MSSA bacteremia and ?left upper extremity cellulitis, suspected aortic valve endocarditis -TEE done on 03/16/2021 showed findings suspicious for vegetation on the aortic valve. -Continue IV cefazolin.  ID recommends total of 6 weeks of antibiotics. 9/15-10/26. Patient to remain inpatient for IV antibiotics due to hx of drug abuse   Atopic eczema, multiple scabbed lesions/rash on bilateral upper extremities, face, chest and back -Monkey pox viral DNA test was negative   SVT -Cardizem for rate control  ADHD -His mother said he does not take Evekeo at home and that he is medically nonadherent.   Substance use disorder -Patient said he uses cocaine and CBD Gummies.  He said he last used cocaine about a week prior to admission.  He said he does not use methadone but his cocaine may have been laced with methadone.  He said he  does not use any IV drugs.  Mother said patient had "snorted" prednisone and amphetamines on 03/10/21.  Reportedly, the prednisone was prescribed for the new lesions that he had developed on his upper extremities. -Repeat UDS 03/18/21 negative   DVT prophylaxis:  enoxaparin (LOVENOX) injection 40 mg Start: 03/11/21 2000 SCDs Start: 03/11/21 1939  Code Status:     Code Status Orders  (From admission, onward)           Start     Ordered   03/11/21 1939  Full code  Continuous        03/11/21 1938           Code Status History     This patient has a current code status but no historical code status.      Family Communication: Mom at bedside Disposition Plan:  Status is: Inpatient  Remains inpatient appropriate because:Inpatient level of care appropriate due to severity of illness  Dispo:  Patient From: Home  Planned Disposition: Home/homeless and surrogate  Medically stable for discharge: No.  Patient will remain in the hospital for IV antibiotic through 10/26      Antimicrobials:  Anti-infectives (From admission, onward)    Start     Dose/Rate Route Frequency Ordered Stop   03/16/21 1830  ceFAZolin (ANCEF) IVPB 2g/100 mL premix        2 g 200 mL/hr over 30 Minutes Intravenous Every 8 hours 03/16/21 1254     03/15/21  1045  valACYclovir (VALTREX) tablet 500 mg        500 mg Oral 2 times daily 03/15/21 0945     03/12/21 1200  vancomycin (VANCOREADY) IVPB 1750 mg/350 mL  Status:  Discontinued        1,750 mg 175 mL/hr over 120 Minutes Intravenous Every 12 hours 03/12/21 0742 03/12/21 1006   03/12/21 1100  vancomycin (VANCOREADY) IVPB 1500 mg/300 mL  Status:  Discontinued        1,500 mg 150 mL/hr over 120 Minutes Intravenous Every 12 hours 03/11/21 2150 03/12/21 0742   03/12/21 1100  ceFAZolin (ANCEF) IVPB 2g/100 mL premix  Status:  Discontinued        2 g 200 mL/hr over 30 Minutes Intravenous Every 8 hours 03/12/21 1006 03/16/21 1254   03/12/21 0000  ceFEPIme  (MAXIPIME) 2 g in sodium chloride 0.9 % 100 mL IVPB  Status:  Discontinued        2 g 200 mL/hr over 30 Minutes Intravenous Every 8 hours 03/11/21 2037 03/12/21 1006   03/11/21 2200  vancomycin (VANCOCIN) IVPB 1000 mg/200 mL premix        1,000 mg 200 mL/hr over 60 Minutes Intravenous  Once 03/11/21 2037 03/12/21 0154   03/11/21 1745  ceFEPIme (MAXIPIME) 2 g in sodium chloride 0.9 % 100 mL IVPB        2 g 200 mL/hr over 30 Minutes Intravenous  Once 03/11/21 1733 03/11/21 1831   03/11/21 1745  metroNIDAZOLE (FLAGYL) IVPB 500 mg        500 mg 100 mL/hr over 60 Minutes Intravenous  Once 03/11/21 1733 03/11/21 1942   03/11/21 1745  vancomycin (VANCOCIN) IVPB 1000 mg/200 mL premix        1,000 mg 200 mL/hr over 60 Minutes Intravenous  Once 03/11/21 1733 03/11/21 1907        Objective: Vitals:   03/22/21 1542 03/22/21 2036 03/23/21 0529 03/23/21 0834  BP: (!) 143/84 138/90 127/73 133/63  Pulse: (!) 101 (!) 104 86 71  Resp: 18 18 16 18   Temp: 98.1 F (36.7 C) 98.3 F (36.8 C) 98.2 F (36.8 C) 98.2 F (36.8 C)  TempSrc: Oral Oral Oral Oral  SpO2: 95% 97% 98% 97%  Weight:      Height:        Intake/Output Summary (Last 24 hours) at 03/23/2021 1036 Last data filed at 03/23/2021 0757 Gross per 24 hour  Intake 360 ml  Output 800 ml  Net -440 ml    Filed Weights   03/11/21 1804 03/11/21 2317 03/17/21 1312  Weight: 89.8 kg 82.5 kg 79.9 kg   Examination: General exam: Appears calm and comfortable    Data Reviewed: I have personally reviewed following labs and imaging studies  CBC: No results for input(s): WBC, NEUTROABS, HGB, HCT, MCV, PLT in the last 168 hours.  Basic Metabolic Panel: Recent Labs  Lab 03/18/21 1137  NA 140  K 3.7  CL 103  CO2 31  GLUCOSE 62*  BUN 11  CREATININE 0.90  CALCIUM 9.0    GFR: Estimated Creatinine Clearance: 139.4 mL/min (by C-G formula based on SCr of 0.9 mg/dL). Liver Function Tests: Recent Labs  Lab 03/18/21 1137  AST 28   ALT 33  ALKPHOS 69  BILITOT 0.4  PROT 7.1  ALBUMIN 4.0    No results for input(s): LIPASE, AMYLASE in the last 168 hours. No results for input(s): AMMONIA in the last 168 hours. Coagulation Profile: No results for input(s):  INR, PROTIME in the last 168 hours.  Cardiac Enzymes: No results for input(s): CKTOTAL, CKMB, CKMBINDEX, TROPONINI in the last 168 hours. BNP (last 3 results) No results for input(s): PROBNP in the last 8760 hours. HbA1C: No results for input(s): HGBA1C in the last 72 hours. CBG: Recent Labs  Lab 03/16/21 1718 03/16/21 2035 03/17/21 0753 03/17/21 1215 03/17/21 1956  GLUCAP 99 139* 89 98 136*    Lipid Profile: No results for input(s): CHOL, HDL, LDLCALC, TRIG, CHOLHDL, LDLDIRECT in the last 72 hours. Thyroid Function Tests: No results for input(s): TSH, T4TOTAL, FREET4, T3FREE, THYROIDAB in the last 72 hours. Anemia Panel: No results for input(s): VITAMINB12, FOLATE, FERRITIN, TIBC, IRON, RETICCTPCT in the last 72 hours. Sepsis Labs: No results for input(s): PROCALCITON, LATICACIDVEN in the last 168 hours.   No results found for this or any previous visit (from the past 240 hour(s)).      Radiology Studies: No results found.    Scheduled Meds:  acidophilus  1 capsule Oral Daily   diltiazem  120 mg Oral Daily   enoxaparin (LOVENOX) injection  40 mg Subcutaneous Q24H   feeding supplement  237 mL Oral TID BM   hydrocerin   Topical BID   sertraline  50 mg Oral Daily   triamcinolone ointment   Topical TID   valACYclovir  500 mg Oral BID   Continuous Infusions:  sodium chloride 10 mL/hr at 03/16/21 0739    ceFAZolin (ANCEF) IV 2 g (03/23/21 0949)     LOS: 12 days      Time spent: 10 minutes   Noralee Stain, DO [facility] 03/23/2021, 10:36 AM   Available via Epic secure chat 7am-7pm After these hours, please refer to coverage provider listed on [email or web address]

## 2021-03-23 NOTE — Progress Notes (Signed)
ID Pt doing okay'no specific complaints Pt denies IV DA- I was under the wrong impression that he used to do IV- he snorted cocaine, did oral crystal meth  O/e awake and alert Patient Vitals for the past 24 hrs:  BP Temp Temp src Pulse Resp SpO2  03/23/21 2022 128/74 98.4 F (36.9 C) -- 81 20 98 %  03/23/21 1726 (!) 120/56 98 F (36.7 C) Oral 98 16 97 %  03/23/21 0834 133/63 98.2 F (36.8 C) Oral 71 18 97 %  03/23/21 0529 127/73 98.2 F (36.8 C) Oral 86 16 98 %    Face - papular /scaly eruption Chest CTA Hss1s2 Abd soft Skin lesions arms better        Labs CBC Latest Ref Rng & Units 03/12/2021 03/11/2021 04/08/2018  WBC 4.0 - 10.5 K/uL 8.5 19.7(H) 7.7  Hemoglobin 13.0 - 17.0 g/dL 78.9 38.1 16.5(H)  Hematocrit 39.0 - 52.0 % 39.8 44.8 45.6  Platelets 150 - 400 K/uL 242 337 290     CMP Latest Ref Rng & Units 03/18/2021 03/13/2021 03/12/2021  Glucose 70 - 99 mg/dL 01(B) 84 510(C)  BUN 6 - 20 mg/dL 11 5(L) 9  Creatinine 5.85 - 1.24 mg/dL 2.77 8.24 2.35  Sodium 135 - 145 mmol/L 140 139 136  Potassium 3.5 - 5.1 mmol/L 3.7 3.6 3.7  Chloride 98 - 111 mmol/L 103 102 103  CO2 22 - 32 mmol/L 31 30 27   Calcium 8.9 - 10.3 mg/dL 9.0 ) 3.6(R)  Total Protein 6.5 - 8.1 g/dL 7.1 - -  Total Bilirubin 0.3 - 1.2 mg/dL 0.4 - -  Alkaline Phos 38 - 126 U/L 69 - -  AST 15 - 41 U/L 28 - -  ALT 0 - 44 U/L 33 - -     Micro 03/11/21 MSSa 03/13/21 BC NG Wound culture 03/11/21 MSSA  Impression/recommendation  MSSA bacteremia due to MSSA skin wounds with underlying atopic eczema TEE questioned lambls excrescence VS endocarditis As we have a source and he is not IVDA less of endocarditis- once he completes 14 days of  IV cefazolin will send hm on weekly long acting glycopeptide along with PO Diclox for 4 weeks  Atopic eczema

## 2021-03-23 NOTE — Progress Notes (Signed)
Nutrition Follow Up Note   DOCUMENTATION CODES:   Not applicable  INTERVENTION:  Continue current diet as ordered, encourage PO intake  Ensure Enlive po TID, each supplement provides 350 kcal and 20 grams of protein  MVI po daily   NUTRITION DIAGNOSIS:   Inadequate oral intake related to acute illness as evidenced by per patient/family report.  GOAL:   Patient will meet greater than or equal to 90% of their needs -met   MONITOR:   PO intake, Supplement acceptance, Labs, Weight trends, Skin, I & O's  ASSESSMENT:   20 y.o. male with medical history significant for eczema, ADHD, substance abuse and asthma who is admitted with sepsis secondary to skin lesions and erythema affecting his upper extremities bilaterally. Pt also developed SVT in hospital.  Pt continues to have a good appetite and oral intake. Routinely accepting ensure supplements. Reviewed weight trends, appears to have weight loss since admission (3.2% from 9/15-9/21) but BMI remains WNL. No new weight this week. No concerns currently, but will monitor trends to determine if accurate or if additional nutrition interventions are needed to prevent further loss. Noted that ID recommended 6 weeks of IV antibiotics. Planned end date of 10/26 - will remain inpatient for administration.    Average Meal Intake: 9/22-9/27: 84% intake x 13 recorded meals (0-100%)  Nutritionally Relevant Medications: Scheduled Meds:  acidophilus  1 capsule Oral Daily   Ensure Enlive  237 mL Oral TID BM   valACYclovir  500 mg Oral BID   Continuous Infusions:   ceFAZolin (ANCEF) IV 2 g (03/23/21 0949)   PRN Meds: ondansetron, polyethylene glycol  Labs Reviewed, none obtained since 9/22  Diet Order:   Diet Order             Diet regular Room service appropriate? Yes; Fluid consistency: Thin  Diet effective now                  EDUCATION NEEDS:   Education needs have been addressed  Skin:  Skin Assessment: Reviewed RN  Assessment  Last BM:  9/26  Height:   Ht Readings from Last 1 Encounters:  03/11/21 $RemoveB'5\' 11"'[id number]$  (1.803 m)    Weight:   Wt Readings from Last 1 Encounters:  03/17/21 79.9 kg    Ideal Body Weight:  78.18 kg  BMI:  Body mass index is 24.57 kg/m.  Estimated Nutritional Needs:   Kcal:  2300-2600kcal/day  Protein:  >115g/day  Fluid:  2.4-2.7L/day  Ranell Patrick, RD, LDN Clinical Dietitian Pager on Amion

## 2021-03-24 ENCOUNTER — Other Ambulatory Visit (HOSPITAL_COMMUNITY): Payer: Self-pay

## 2021-03-24 DIAGNOSIS — L0390 Cellulitis, unspecified: Secondary | ICD-10-CM | POA: Diagnosis not present

## 2021-03-24 DIAGNOSIS — F1990 Other psychoactive substance use, unspecified, uncomplicated: Secondary | ICD-10-CM | POA: Diagnosis present

## 2021-03-24 DIAGNOSIS — L209 Atopic dermatitis, unspecified: Secondary | ICD-10-CM | POA: Diagnosis present

## 2021-03-24 DIAGNOSIS — A419 Sepsis, unspecified organism: Secondary | ICD-10-CM | POA: Diagnosis not present

## 2021-03-24 DIAGNOSIS — R21 Rash and other nonspecific skin eruption: Secondary | ICD-10-CM | POA: Diagnosis not present

## 2021-03-24 DIAGNOSIS — B9561 Methicillin susceptible Staphylococcus aureus infection as the cause of diseases classified elsewhere: Secondary | ICD-10-CM | POA: Diagnosis not present

## 2021-03-24 DIAGNOSIS — R6520 Severe sepsis without septic shock: Secondary | ICD-10-CM | POA: Diagnosis not present

## 2021-03-24 DIAGNOSIS — R7881 Bacteremia: Secondary | ICD-10-CM | POA: Diagnosis not present

## 2021-03-24 NOTE — Assessment & Plan Note (Signed)
-  Continue cefazolin -Consult ID

## 2021-03-24 NOTE — Assessment & Plan Note (Signed)
Continue Cardizem. 

## 2021-03-24 NOTE — Progress Notes (Signed)
Progress Note  Patient Name: Randy Henry Date of Encounter: 03/24/2021  [facility] Cardiologist: None   Subjective   Doing okay, left arm wound redness is improving, denies fevers or chills.  Inpatient Medications    Scheduled Meds:  acidophilus  1 capsule Oral Daily   diltiazem  120 mg Oral Daily   enoxaparin (LOVENOX) injection  40 mg Subcutaneous Q24H   feeding supplement  237 mL Oral TID BM   hydrocerin   Topical BID   sertraline  50 mg Oral Daily   triamcinolone ointment   Topical TID   valACYclovir  500 mg Oral BID   Continuous Infusions:  sodium chloride 10 mL/hr at 03/16/21 0739    ceFAZolin (ANCEF) IV 2 g (03/24/21 1147)   PRN Meds: sodium chloride, acetaminophen, melatonin, ondansetron (ZOFRAN) IV, oxyCODONE, polyethylene glycol   Vital Signs    Vitals:   03/23/21 1726 03/23/21 2022 03/24/21 0733 03/24/21 1534  BP: (!) 120/56 128/74 128/77 140/73  Pulse: 98 81 87 92  Resp: 16 20    Temp: 98 F (36.7 C) 98.4 F (36.9 C) 97.7 F (36.5 C) 98.4 F (36.9 C)  TempSrc: Oral   Oral  SpO2: 97% 98% 98% 99%  Weight:      Height:        Intake/Output Summary (Last 24 hours) at 03/24/2021 1726 Last data filed at 03/24/2021 1410 Gross per 24 hour  Intake 360 ml  Output --  Net 360 ml   Last 3 Weights 03/17/2021 03/11/2021 03/11/2021  Weight (lbs) 176 lb 2.4 oz 181 lb 14.1 oz 198 lb  Weight (kg) 79.9 kg 82.5 kg 89.812 kg  Some encounter information is confidential and restricted. Go to Review Flowsheets activity to see all data.      Telemetry    Currently off telemetry- Personally Reviewed  ECG    No new tracing reviewed- Personally Reviewed  Physical Exam   GEN: No acute distress.   Neck: No JVD Cardiac: RRR, no murmurs, rubs, or gallops.  Respiratory: Clear to auscultation bilaterally. GI: Soft, nontender, non-distended  MS: No edema; excoriations noted in both arms bilaterally Neuro:  Nonfocal  Psych: Normal affect   Labs    High  Sensitivity Troponin:  No results for input(s): TROPONINIHS in the last 720 hours.   Chemistry Recent Labs  Lab 03/18/21 1137  NA 140  K 3.7  CL 103  CO2 31  GLUCOSE 62*  BUN 11  CREATININE 0.90  CALCIUM 9.0  PROT 7.1  ALBUMIN 4.0  AST 28  ALT 33  ALKPHOS 69  BILITOT 0.4  GFRNONAA >60  ANIONGAP 6    Lipids No results for input(s): CHOL, TRIG, HDL, LABVLDL, LDLCALC, CHOLHDL in the last 168 hours.  HematologyNo results for input(s): WBC, RBC, HGB, HCT, MCV, MCH, MCHC, RDW, PLT in the last 168 hours. Thyroid No results for input(s): TSH, FREET4 in the last 168 hours.  BNPNo results for input(s): BNP, PROBNP in the last 168 hours.  DDimer No results for input(s): DDIMER in the last 168 hours.   Radiology    No results found.  Cardiac Studies   TEE 03/16/2021 reviewed by myself with normal EF, lambl's excrescences noted in aortic valve, no evidence for endocarditis  Patient Profile     20 y.o. male with history of atopic eczema presenting with left arm wound and erythema, diagnosed with MSSA bacteremia.  Being seen for possible endocarditis  Assessment & Plan    Bacteremia,  MSSA -TEE reviewed by myself without evidence for endocarditis -lambl's excrescences noted on aortic valve -Continue IV antibiotics for bacteremia as per ID  2.  Excoriations, erythema -Left arm wound and erythema appears to be improving with antibiotics. -Management as per medicine team   No additional cardiac input at this point.  Please let us know if further input is needed.  Cardiology will sign off.  Total encounter time 35 minutes  Greater than 50% was spent in counseling and coordination of care with the patient       Signed, Debbe Odea, MD  03/24/2021, 5:26 PM

## 2021-03-24 NOTE — TOC Progression Note (Signed)
Transition of Care [facility]) - Progression Note    Patient Details  Name: Randy Henry MRN: [id number] Date of Birth: [birth year]  Transition of Care [facility]) CM/SW Contact  Beverly Sessions, RN Phone Number: 03/24/2021, 3:58 PM  Clinical Narrative:     There is potential that patient will discharge on oral antibiotics, and require to come weekly for IV antibiotic infusions  Met with patient at bedside.  He states that his friend Lysbeth Galas will be coming to the hospital today.  His plans are to discharge to [facility] house.  Patient states that his car is in the parking lot and he has reliable transport to get to the weekly infusions.  Patient is aware that he would not have a PICC line at discharge  Patient has been provided with outpatient and residential treatment options.  I provided him a copy of resources again today.Patient states that he is not interested in them at this time.  Patient states "I know you have heard this before, but this was an eye opening  experience for me and I am not going to be using again"  We discussed the benefits of having additional support in helping him with his goal. He was agreeable for me to leave a copy at the bedside    Patient gave me permission to call his mom and update.  She expressed concerns about patient following the medication plan after he leaves, and also has concerns that he will continue to use drugs at discharge.  I updated her with the information above  Mother states that if needed she can transport patient to weekly infusions. It is not an option for patient to stay with family at discharge, and she is aware that patient intends to stay with a friend  She inquires if there will be additional lab work and cardiac clearance prior to discharge.  Message sent to MD Mother is aware that MD, cardiology, and ID will be discussing the case further prior to discharge     Expected Discharge Plan and Services                                                  Social Determinants of Health (SDOH) Interventions    Readmission Risk Interventions No flowsheet data found.

## 2021-03-24 NOTE — Assessment & Plan Note (Signed)
Patient said he uses cocaine and CBD Gummies. He said he last used cocaine about a week prior to admission. He said he does not use methadone but his cocaine may have been laced with methadone. He said he does not use any IV drugs. Mother said patient had "snorted" prednisone and amphetamines on 03/10/21. Reportedly, the prednisone was prescribed for the new lesions that he had developed on his upper extremities.

## 2021-03-24 NOTE — Progress Notes (Signed)
Progress Note    Randy Henry   RSW:[id number]  DOB: [birth year]  DOA: 03/11/2021     13 Date of Service: 03/24/2021     Subjective:  Patient has had no fever, headache, chest pain, abdominal pain.  No new focal weakness or numbness.  His eczema seems worsening.  Hospital Problems * Severe sepsis (HCC) -Continue cefazolin -Consult ID  MSSA bacteremia Endocarditis ruled out  SVT (supraventricular tachycardia) (HCC) -Continue Cardizem  Atopic eczema -Start prednisone  Substance use disorder Patient said he uses cocaine and CBD Gummies.  He said he last used cocaine about a week prior to admission.  He said he does not use methadone but his cocaine may have been laced with methadone.  He said he does not use any IV drugs.  Mother said patient had "snorted" prednisone and amphetamines on 03/10/21.  Reportedly, the prednisone was prescribed for the new lesions that he had developed on his upper extremities.  ADHD (attention deficit hyperactivity disorder), combined type Non adherent to meds     Objective Vital signs were reviewed and unremarkable.  Vitals:   03/23/21 1726 03/23/21 2022 03/24/21 0733 03/24/21 1534  BP: (!) 120/56 128/74 128/77 140/73  Pulse: 98 81 87 92  Resp: 16 20    Temp: 98 F (36.7 C) 98.4 F (36.9 C) 97.7 F (36.5 C) 98.4 F (36.9 C)  TempSrc: Oral   Oral  SpO2: 97% 98% 98% 99%  Weight:      Height:       79.9 kg  Exam Physical Exam Constitutional:      General: He is not in acute distress. HENT:     Nose: No nasal deformity or rhinorrhea.     Mouth/Throat:     Lips: Pink. No lesions.     Mouth: Mucous membranes are moist. No oral lesions.     Dentition: Normal dentition.     Pharynx: Oropharynx is clear. No posterior oropharyngeal erythema.  Eyes:     General: Lids are normal. Gaze aligned appropriately.     Extraocular Movements: Extraocular movements intact.     Conjunctiva/sclera: Conjunctivae normal.  Cardiovascular:     Rate  and Rhythm: Normal rate and regular rhythm.     Pulses:          Radial pulses are 2+ on the right side and 2+ on the left side.     Heart sounds: Normal heart sounds, S1 normal and S2 normal. No murmur heard. Pulmonary:     Effort: Pulmonary effort is normal. No respiratory distress.     Breath sounds: No wheezing or rales.  Abdominal:     General: There is no distension.     Palpations: Abdomen is soft.     Tenderness: There is no abdominal tenderness. There is no guarding or rebound.  Musculoskeletal:     Right lower leg: No edema.     Left lower leg: No edema.  Skin:    General: Skin is warm and dry.     Findings: Lesion and rash present.     Comments: Diffuse eczematous rash, worse on the arms, face, upper chest  Neurological:     Mental Status: He is alert and oriented to person, place, and time.     Cranial Nerves: Cranial nerves are intact.     Motor: No weakness.  Psychiatric:        Attention and Perception: Attention normal.        Mood and Affect: Mood and affect  normal.        Behavior: Behavior is cooperative.        Cognition and Memory: Memory normal.        Judgment: Judgment normal.      Labs / Other Information My review of labs, imaging, notes and other tests shows no new significant findings.     Time spent: 25 minutes [facility] 03/24/2021, 1:19 PM

## 2021-03-24 NOTE — Assessment & Plan Note (Signed)
Endocarditis ruled out

## 2021-03-24 NOTE — Assessment & Plan Note (Signed)
Start prednisone.

## 2021-03-24 NOTE — Assessment & Plan Note (Signed)
Non adherent to meds

## 2021-03-24 NOTE — Progress Notes (Signed)
   Date of Admission:  03/11/2021     ID: BRYLEE MCGREAL is a 20 y.o. male  Principal Problem:   Severe sepsis (HCC) Active Problems:   ADHD (attention deficit hyperactivity disorder), combined type   MSSA bacteremia   SVT (supraventricular tachycardia) (HCC)   Atopic eczema   Substance use disorder    Subjective: Pt still scratching and rubbing his skin Wounds on the left arm better  Medications:   acidophilus  1 capsule Oral Daily   diltiazem  120 mg Oral Daily   enoxaparin (LOVENOX) injection  40 mg Subcutaneous Q24H   feeding supplement  237 mL Oral TID BM   hydrocerin   Topical BID   sertraline  50 mg Oral Daily   triamcinolone ointment   Topical TID   valACYclovir  500 mg Oral BID    Objective: Vital signs in last 24 hours: Temp:  [97.7 F (36.5 C)-98.4 F (36.9 C)] 97.7 F (36.5 C) (09/28 0733) Pulse Rate:  [81-98] 87 (09/28 0733) Resp:  [16-20] 20 (09/27 2022) BP: (120-128)/(56-77) 128/77 (09/28 0733) SpO2:  [97 %-98 %] 98 % (09/28 0733)  PHYSICAL EXAM:  General: Alert, cooperative, no distress, appears stated age.  Head: Normocephalic, without obvious abnormality, atraumatic. Eyes: Conjunctivae clear, anicteric sclerae. Pupils are equal ENT Nares normal. No drainage or sinus tenderness. Lips, mucosa, and tongue normal. No Thrush Neck: Supple, symmetrical, no adenopathy, thyroid: non tender no carotid bruit and no JVD. Back: No CVA tenderness. Lungs: Clear to auscultation bilaterally. No Wheezing or Rhonchi. No rales. Heart: Regular rate and rhythm, no murmur, rub or gallop. Abdomen: Soft, non-tender,not distended. Bowel sounds normal. No masses Extremities: wounds are healing but he has underlying atopic eczema and is scratching his skin On admission       03/23/21       Skin: papular eruption , scaling face   Lymph: Cervical, supraclavicular normal. Neurologic: Grossly non-focal   Microbiology: Micro 03/11/21 MSSa 03/13/21 BC NG Wound  culture 03/11/21 MSSA    Assessment/Plan: MSSa bacteremia secondary to skin and soft tissue infection with cellulitis of arms Repeat blood culture neg 2 d echo neg Initally there was aquestion of lambl's excrescence vs aortic endocarditis Discussed with cardiologist Dr.End.  TEE was evaluated by Dr.Agbor Sandie Ano and no endocarditis seen- only lambl's excrescence  Pt is day 14 of IV cefazolin So now he could be discharged on Linezolid 600mg  PO BID + rifampin 600mg  PO Bid for treating MSSA bacteremia with oral antibiotics- this regimen will also act as decolonization of the skin because he is colonized with staph due to underlying atopic eczema I will see him next week on 04/01/21 10Am and get labs- cbc/cmp Discussed with patient in detail Discussed with is mom over phone Discussed with care team

## 2021-03-25 LAB — CBC WITH DIFFERENTIAL/PLATELET
Abs Immature Granulocytes: 0.06 10*3/uL (ref 0.00–0.07)
Basophils Absolute: 0.1 10*3/uL (ref 0.0–0.1)
Basophils Relative: 1 %
Eosinophils Absolute: 1 10*3/uL — ABNORMAL HIGH (ref 0.0–0.5)
Eosinophils Relative: 10 %
HCT: 43.4 % (ref 39.0–52.0)
Hemoglobin: 15.7 g/dL (ref 13.0–17.0)
Immature Granulocytes: 1 %
Lymphocytes Relative: 24 %
Lymphs Abs: 2.4 10*3/uL (ref 0.7–4.0)
MCH: 30.5 pg (ref 26.0–34.0)
MCHC: 36.2 g/dL — ABNORMAL HIGH (ref 30.0–36.0)
MCV: 84.4 fL (ref 80.0–100.0)
Monocytes Absolute: 0.6 10*3/uL (ref 0.1–1.0)
Monocytes Relative: 6 %
Neutro Abs: 5.9 10*3/uL (ref 1.7–7.7)
Neutrophils Relative %: 58 %
Platelets: 334 10*3/uL (ref 150–400)
RBC: 5.14 MIL/uL (ref 4.22–5.81)
RDW: 12.2 % (ref 11.5–15.5)
WBC: 10 10*3/uL (ref 4.0–10.5)
nRBC: 0 % (ref 0.0–0.2)

## 2021-03-25 LAB — COMPREHENSIVE METABOLIC PANEL
ALT: 34 U/L (ref 0–44)
AST: 36 U/L (ref 15–41)
Albumin: 3.9 g/dL (ref 3.5–5.0)
Alkaline Phosphatase: 70 U/L (ref 38–126)
Anion gap: 11 (ref 5–15)
BUN: 15 mg/dL (ref 6–20)
CO2: 27 mmol/L (ref 22–32)
Calcium: 9.2 mg/dL (ref 8.9–10.3)
Chloride: 100 mmol/L (ref 98–111)
Creatinine, Ser: 0.86 mg/dL (ref 0.61–1.24)
GFR, Estimated: >60 mL/min (ref >60)
Glucose, Bld: 105 mg/dL — ABNORMAL HIGH (ref 70–99)
Potassium: 3.9 mmol/L (ref 3.5–5.1)
Sodium: 138 mmol/L (ref 135–145)
Total Bilirubin: 0.6 mg/dL (ref 0.3–1.2)
Total Protein: 7.4 g/dL (ref 6.5–8.1)

## 2021-03-25 LAB — C-REACTIVE PROTEIN: CRP: 1.4 mg/dL — ABNORMAL HIGH (ref <1.0)

## 2021-03-25 LAB — SEDIMENTATION RATE: Sed Rate: 10 mm/hr (ref 0–15)

## 2021-03-25 MED ORDER — LINEZOLID 600 MG PO TABS: 600.0000 mg | ORAL_TABLET | Freq: Two times a day (BID) | ORAL | 0 refills | Status: AC

## 2021-03-25 MED ORDER — SERTRALINE HCL 50 MG PO TABS: 50.0000 mg | ORAL_TABLET | Freq: Every day | ORAL | 3 refills | Status: AC

## 2021-03-25 MED ORDER — PREDNISONE 20 MG PO TABS: 40.0000 mg | ORAL_TABLET | Freq: Every day | ORAL | 0 refills | Status: AC

## 2021-03-25 MED ORDER — RIFAMPIN 300 MG PO CAPS: 600.0000 mg | ORAL_CAPSULE | Freq: Two times a day (BID) | ORAL | 0 refills | Status: AC

## 2021-03-25 NOTE — TOC Transition Note (Signed)
Transition of Care [facility]) - CM/SW Discharge Note   Patient Details  Name: Randy Henry MRN: [id number] Date of Birth: [birth year]  [facility]) CM/SW Contact:  Chapman Fitch, RN Phone Number: 03/25/2021, 10:32 AM   Clinical Narrative:     Patient to discharge today Patient has been transitioned to full oral antibiotic regimen MD has updated patient and mom.       Final next level of care: Home/Self Care     Patient Goals and CMS Choice        Discharge Placement                       Discharge Plan and Services                                     Social Determinants of Health (SDOH) Interventions     Readmission Risk Interventions No flowsheet data found.

## 2021-03-25 NOTE — Discharge Summary (Signed)
Physician Discharge Summary  [name] AVW:[id number] DOB: 2000/07/10 DOA: 03/11/2021  PCP: [name], MD  Admit date: 03/11/2021 Discharge date: 03/25/2021  Admitted From: Home  Disposition:  Friend's home   Recommendations for Outpatient Follow-up:  Follow up with Dr. Athena Masse or new PCP in 1 week Follow up with Dr. Rivka Safer ID in 10 days Dr. Rivka Safer to obtain CBC\D and CMP on follow up Follow up with Dr. Adolphus Birchwood Dermatology in 2-4 weeks     Home Health: None  Equipment/Devices: None new  Discharge Condition: Good  CODE STATUS: FULL Diet recommendation: Regular  Brief/Interim Summary: Randy Henry is a 20 y.o. M with severe atopic eczema and ADHD on amphetamine who presented with worsening skin lesions and surrounding redness and pain.  In the ER, noted to have WBC 19K, lactate 5.3, HR >90.     PRINCIPAL HOSPITAL DIAGNOSIS: Severe sepsis due to MSSA bacteremia    Discharge Diagnoses:   Severe sepsis due to MSSA bacteremia Admitted and started on empiric antibiotics.  Cultures grew MSSA in 3/4.  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  He defervesced and completed 2 weeks IV cefazolin in the hospital.  ID recommended 2 further weeks of linezolid and rifampin and arranged outpatient follow up.  Cardiology were consulted, and it was felt ultimately that in the absence of clear IVDU, his TEE findings were more consistent with Lambl's excrescences and not endocarditis.   Atopic eczema Poorly controlled at present.  Some component of this is lack of adherence to Dermatology treatment plan.  Adherence stressed.  Patient given prednisone 40 for 5 days (discussed with ID) and encouraged to follow up with Dermatology.  ADHD Substance use disorder Patient reported inappropriate use of prescription medications, including snorting his amphetamine and prednisone.  He was provided with inpatient and outpatient substance use  treatment programs.  He was encouraged to seek follow up with a substance use program.  He expressed intent to avoid all illicit drug use.  Mood disorder Patient was restarted on sertraline in the hospital  Supraventricular tachycardia Patient had nonsustained SVT while here.  Not Afib.  He was treated with Cardizem briefly.  Given this occurred in the setting of amphetamine use, I do not recommend continued treatment unless this should reoccur and be bothersome in the absence of amphetamine.       Discharge Instructions  Discharge Instructions     Discharge instructions   Complete by: As directed    From Dr. Maryfrances Bunnell: You were admitted for staph bacteremia.  In other words, you had Staphylococcus aureus (a type of common skin and nose bacteria) in your blood. Staph in the blood stream is LIFE THREATENING Treatment was urgent and without getting treatment in a timely manner, you would certainly have died. We believe your infection came from the scabs from your eczema, and/or from snorting things (this takes substances straight into the bloodstream from the nose, where staph often lives).   For the next two weeks: Take linezolid 600 mg twice daily Take rifampin 600 mg twice daily  Go see Dr. Rivka Safer in 10 days at her office    For your eczema: Take prednisone 40 mg Fri, Sat and Sun mornings Then go see Dr. Adolphus Birchwood Use your home triamcinolone ointment.   For your mood: Take sertraline 50 mg daily You should establish with a new primary care doctor    While you were here, you had concern for endocarditis.  Although  ultimately, we feel you did not have endocarditis, and we feel your heart is okay now, staph bacteremia puts your heart MAJORLY at risk for damage.  Any future infection like this has major risk to cause heart damage, even if you escaped it this time.  While you did have some fast heart rate here, ultimately we think this was caused by amphetamine, and does  not require ongoing treatment   Increase activity slowly   Complete by: As directed       Allergies as of 03/25/2021       Reactions   Shellfish-derived Products         Medication List     STOP taking these medications    Amphetamine Sulfate 10 MG Tabs Commonly known as: Evekeo   predniSONE 5 MG (21) Tbpk tablet Commonly known as: STERAPRED UNI-PAK 21 TAB Replaced by: predniSONE 20 MG tablet       TAKE these medications    linezolid 600 MG tablet Commonly known as: ZYVOX Take 1 tablet (600 mg total) by mouth 2 (two) times daily for 14 days.   predniSONE 20 MG tablet Commonly known as: DELTASONE Take 2 tablets (40 mg total) by mouth daily with breakfast. Replaces: predniSONE 5 MG (21) Tbpk tablet   rifampin 300 MG capsule Commonly known as: Rifadin Take 2 capsules (600 mg total) by mouth 2 (two) times daily for 14 days.   sertraline 50 MG tablet Commonly known as: ZOLOFT Take 1 tablet (50 mg total) by mouth daily. What changed:  medication strength See the new instructions.   triamcinolone ointment 0.1 % Commonly known as: KENALOG APP EXT AA BID UNTIL CLEAR THEN PRN   valACYclovir 500 MG tablet Commonly known as: VALTREX Take 500 mg by mouth daily.        Follow-up Information     [name], MD. Schedule an appointment as soon as possible for a visit in 1 week(s).   Specialty: Pediatrics Why: Or contact a new primary care doctor for follow up Contact information: [address] [place] [address] [phone]         Lynn Ito, MD. Schedule an appointment as soon as possible for a visit in 10 day(s).   Specialty: Infectious Diseases Contact information: [address] [place] [address] [phone]         Dasher, Cliffton Asters, MD. Schedule an appointment as soon as possible for a visit.   Specialty: Dermatology Contact information: [address] [place] [address] [phone]                 Allergies  Allergen Reactions   Shellfish-Derived Products     Consultations: Infectious disease Cardoilogy   Procedures/Studies: DG Chest 2 View  Result Date: 03/12/2021 CLINICAL DATA:  Sepsis COVID EXAM: CHEST - 2 VIEW COMPARISON:  09/22/2010 FINDINGS: The heart size and mediastinal contours are within normal limits. Both lungs are clear. The visualized skeletal structures are unremarkable. IMPRESSION: No active cardiopulmonary disease. Electronically Signed   By: Jasmine Pang M.D.   On: 03/12/2021 18:14   ECHOCARDIOGRAM COMPLETE  Result Date: 03/14/2021    ECHOCARDIOGRAM REPORT   Patient Name:   Randy Henry Date of Exam: 03/14/2021 Medical Rec #:  [id number]     Height:       71.0 in Accession #:    [id number]    Weight:       181.9 lb Date of Birth:  2001-05-13  BSA:          2.025 m Patient Age:    20 years      BP:           140/73 mmHg Patient Gender: M             HR:           73 bpm. Exam Location:  [facility] Procedure: 2D Echo, Cardiac Doppler, Color Doppler and Strain Analysis Indications:     Bacteremia R78.81  History:         Patient has no prior history of Echocardiogram examinations.  Sonographer:     Neysa Bonito Roar Referring Phys:  [id number] Lurene Shadow Diagnosing Phys: Julien Nordmann MD IMPRESSIONS  1. No valve endocarditis noted  2. Left ventricular ejection fraction, by estimation, is 60 to 65%. The left ventricle has normal function. The left ventricle has no regional wall motion abnormalities. Left ventricular diastolic parameters were normal. The average left ventricular global longitudinal strain is -19.5 %. The global longitudinal strain is normal.  3. Right ventricular systolic function is normal. The right ventricular size is normal.  4. The mitral valve is normal in structure. No evidence of mitral valve regurgitation. No evidence of mitral stenosis.  5. The aortic valve is normal in structure. Aortic valve regurgitation is not visualized. No aortic stenosis  is present.  6. The inferior vena cava is normal in size with greater than 50% respiratory variability, suggesting right atrial pressure of 3 mmHg. FINDINGS  Left Ventricle: Left ventricular ejection fraction, by estimation, is 60 to 65%. The left ventricle has normal function. The left ventricle has no regional wall motion abnormalities. The average left ventricular global longitudinal strain is -19.5 %. The global longitudinal strain is normal. The left ventricular internal cavity size was normal in size. There is no left ventricular hypertrophy. Left ventricular diastolic parameters were normal. Right Ventricle: The right ventricular size is normal. No increase in right ventricular wall thickness. Right ventricular systolic function is normal. Left Atrium: Left atrial size was normal in size. Right Atrium: Right atrial size was normal in size. Pericardium: There is no evidence of pericardial effusion. Mitral Valve: The mitral valve is normal in structure. No evidence of mitral valve regurgitation. No evidence of mitral valve stenosis. Tricuspid Valve: The tricuspid valve is normal in structure. Tricuspid valve regurgitation is not demonstrated. No evidence of tricuspid stenosis. Aortic Valve: The aortic valve is normal in structure. Aortic valve regurgitation is not visualized. No aortic stenosis is present. Aortic valve peak gradient measures 6.4 mmHg. Pulmonic Valve: The pulmonic valve was normal in structure. Pulmonic valve regurgitation is not visualized. No evidence of pulmonic stenosis. Aorta: The aortic root is normal in size and structure. Venous: The inferior vena cava is normal in size with greater than 50% respiratory variability, suggesting right atrial pressure of 3 mmHg. IAS/Shunts: No atrial level shunt detected by color flow Doppler.  LEFT VENTRICLE PLAX 2D LVIDd:         4.60 cm  Diastology LVIDs:         3.40 cm  LV e' medial:    10.80 cm/s LV PW:         1.00 cm  LV E/e' medial:  7.0 LV IVS:         1.00 cm  LV e' lateral:   11.50 cm/s LVOT diam:     2.50 cm  LV E/e' lateral: 6.6 LVOT Area:     4.91 cm  2D Longitudinal Strain                         2D Strain GLS Avg:     -19.5 % RIGHT VENTRICLE RV Basal diam:  3.30 cm RV Mid diam:    2.80 cm RV S prime:     14.00 cm/s TAPSE (M-mode): 2.3 cm LEFT ATRIUM             Index       RIGHT ATRIUM           Index LA diam:        3.30 cm 1.63 cm/m  RA Area:     13.50 cm LA Vol (A2C):   41.5 ml 20.49 ml/m RA Volume:   32.00 ml  15.80 ml/m LA Vol (A4C):   30.8 ml 15.21 ml/m LA Biplane Vol: 38.8 ml 19.16 ml/m  AORTIC VALVE                PULMONIC VALVE AV Area (Vmax): 4.44 cm    PV Vmax:        0.87 m/s AV Vmax:        126.00 cm/s PV Peak grad:   3.0 mmHg AV Peak Grad:   6.4 mmHg    RVOT Peak grad: 3 mmHg LVOT Vmax:      114.00 cm/s  AORTA Ao Root diam: 2.80 cm MITRAL VALVE MV Area (PHT): 3.30 cm    SHUNTS MV Decel Time: 230 msec    Systemic Diam: 2.50 cm MV E velocity: 75.40 cm/s MV A velocity: 38.10 cm/s MV E/A ratio:  1.98 MV A Prime:    8.3 cm/s Julien Nordmann MD Electronically signed by Julien Nordmann MD Signature Date/Time: 03/14/2021/7:07:33 PM    Final    ECHO TEE  Result Date: 03/16/2021    TRANSESOPHOGEAL ECHO REPORT   Patient Name:   Harriet Masson Date of Exam: 03/16/2021 Medical Rec #:  [id number]     Height:       71.0 in Accession #:    [id number]    Weight:       181.9 lb Date of Birth:  25-Sep-2000     BSA:          2.025 m Patient Age:    20 years      BP:           115/71 mmHg Patient Gender: M             HR:           83 bpm. Exam Location:  [facility] Procedure: Transesophageal Echo, Cardiac Doppler and Color Doppler Indications:     Bacteremia R78.81  History:         Patient has prior history of Echocardiogram examinations, most                  recent 03/14/2021. ADHD, SVT.  Sonographer:     Cristela Blue Referring Phys:  [id number] Emmilynn Marut END Diagnosing Phys: Yvonne Kendall MD PROCEDURE: After discussion of the risks and  benefits of a TEE, an informed consent was obtained from the patient. TEE procedure time was 25 minutes. The transesophogeal probe was passed without difficulty through the esophogus of the patient. Local oropharyngeal anesthetic was provided with viscous lidocaine. Sedation performed by performing physician. Patients was under conscious sedation during this procedure. Anesthetic administered: of Fentanyl, 5.0mg  of Versed. Image quality was good. The patient's vital signs; including heart  rate, blood pressure, and oxygen saturation; remained stable throughout the procedure. The patient developed no complications during the procedure. IMPRESSIONS  1. Left ventricular ejection fraction, by estimation, is >55%. The left ventricle has normal function.  2. Right ventricular systolic function is normal. The right ventricular size is normal.  3. No left atrial/left atrial appendage thrombus was detected.  4. The mitral valve is normal in structure. Trivial mitral valve regurgitation.  5. There is a tiny mobile echodensity adjacent to the right coronary leaflet in the LVOT suspicious for a vegetation in the setting of MSSA bacteremia, though a Lambl's excrescence cannot be excluded. The aortic valve is tricuspid. Aortic valve regurgitation is trivial. FINDINGS  Left Ventricle: Left ventricular ejection fraction, by estimation, is >55%. The left ventricle has normal function. Right Ventricle: The right ventricular size is normal. Right ventricular systolic function is normal. Left Atrium: No left atrial/left atrial appendage thrombus was detected. Pericardium: There is no evidence of pericardial effusion. Mitral Valve: The mitral valve is normal in structure. Trivial mitral valve regurgitation. Tricuspid Valve: The tricuspid valve is normal in structure. Tricuspid valve regurgitation is mild. Aortic Valve: There is a tiny mobile echodensity adjacent to the right coronary leaflet in the LVOT suspicious for a vegetation  in the setting of MSSA bacteremia, though a Lambl's excrescence cannot be excluded. The aortic valve is tricuspid. Aortic valve regurgitation is trivial. A mobile vegetation is seen. Pulmonic Valve: The pulmonic valve was normal in structure. Pulmonic valve regurgitation is trivial. Aorta: The aortic root and ascending aorta are structurally normal, with no evidence of dilitation. IAS/Shunts: No atrial level shunt detected by color flow Doppler. Yvonne Kendall MD Electronically signed by Yvonne Kendall MD Signature Date/Time: 03/16/2021/1:01:32 PM    Final       Subjective: No chest pain, dyspnea, fever, confusion.  No new rashes, no new joint pain.  Discharge Exam: Vitals:   03/25/21 0510 03/25/21 0827  BP: 121/77 136/69  Pulse: 80 81  Resp: 18 18  Temp: 98.2 F (36.8 C) (!) 97.4 F (36.3 C)  SpO2: 97% 97%   Vitals:   03/24/21 1534 03/24/21 2015 03/25/21 0510 03/25/21 0827  BP: 140/73 (!) 143/88 121/77 136/69  Pulse: 92 95 80 81  Resp:  18 18 18   Temp: 98.4 F (36.9 C) 98 F (36.7 C) 98.2 F (36.8 C) (!) 97.4 F (36.3 C)  TempSrc: Oral   Oral  SpO2: 99% 98% 97% 97%  Weight:      Height:        General: Pt is alert, awake, not in acute distress Cardiovascular: RRR, nl S1-S2, no murmurs appreciated.   No LE edema.   Respiratory: Normal respiratory rate and rhythm.  CTAB without rales or wheezes. Abdominal: Abdomen soft and non-tender.  No distension or HSM.   Neuro/Psych: Strength symmetric in upper and lower extremities.  Judgment and insight appear normal.   The results of significant diagnostics from this hospitalization (including imaging, microbiology, ancillary and laboratory) are listed below for reference.     Microbiology: No results found for this or any previous visit (from the past 240 hour(s)).   Labs: BNP (last 3 results) No results for input(s): BNP in the last 8760 hours. Basic Metabolic Panel: Recent Labs  Lab 03/25/21 0456  NA 138  K 3.9  CL  100  CO2 27  GLUCOSE 105*  BUN 15  CREATININE 0.86  CALCIUM 9.2   Liver Function Tests: Recent Labs  Lab 03/25/21 0456  AST 36  ALT 34  ALKPHOS 70  BILITOT 0.6  PROT 7.4  ALBUMIN 3.9   No results for input(s): LIPASE, AMYLASE in the last 168 hours. No results for input(s): AMMONIA in the last 168 hours. CBC: Recent Labs  Lab 03/25/21 0456  WBC 10.0  NEUTROABS 5.9  HGB 15.7  HCT 43.4  MCV 84.4  PLT 334   Cardiac Enzymes: No results for input(s): CKTOTAL, CKMB, CKMBINDEX, TROPONINI in the last 168 hours. BNP: Invalid input(s): POCBNP CBG: No results for input(s): GLUCAP in the last 168 hours. D-Dimer No results for input(s): DDIMER in the last 72 hours. Hgb A1c No results for input(s): HGBA1C in the last 72 hours. Lipid Profile No results for input(s): CHOL, HDL, LDLCALC, TRIG, CHOLHDL, LDLDIRECT in the last 72 hours. Thyroid function studies No results for input(s): TSH, T4TOTAL, T3FREE, THYROIDAB in the last 72 hours.  Invalid input(s): FREET3 Anemia work up No results for input(s): VITAMINB12, FOLATE, FERRITIN, TIBC, IRON, RETICCTPCT in the last 72 hours. Urinalysis    Component Value Date/Time   COLORURINE STRAW (A) 03/12/2021 0155   APPEARANCEUR CLEAR (A) 03/12/2021 0155   LABSPEC 1.006 03/12/2021 0155   PHURINE 8.0 03/12/2021 0155   GLUCOSEU NEGATIVE 03/12/2021 0155   HGBUR NEGATIVE 03/12/2021 0155   BILIRUBINUR NEGATIVE 03/12/2021 0155   KETONESUR NEGATIVE 03/12/2021 0155   PROTEINUR NEGATIVE 03/12/2021 0155   NITRITE NEGATIVE 03/12/2021 0155   LEUKOCYTESUR NEGATIVE 03/12/2021 0155   Sepsis Labs Invalid input(s): PROCALCITONIN,  WBC,  LACTICIDVEN Microbiology No results found for this or any previous visit (from the past 240 hour(s)).   Time coordinating discharge: 25 minutes The [place] controlled substances registry was reviewed for this patient      30 Day Unplanned Readmission Risk Score    Flowsheet Row ED to Hosp-Admission  (Discharged) from 03/11/2021 in [facility]  30 Day Unplanned Readmission Risk Score (%) 10.82 Filed at 03/25/2021 1200       This score is the patient's risk of an unplanned readmission within 30 days of being discharged (0 -100%). The score is based on dignosis, age, lab data, medications, orders, and past utilization.   Low:  0-14.9   Medium: 15-21.9   High: 22-29.9   Extreme: 30 and above            SIGNED:   Alberteen Sam, MD  [facility] 03/25/2021, 2:59 PM

## 2021-03-25 NOTE — Progress Notes (Signed)
Patient discharged from unit. Patient refused wheelchair for discharge.  Patient discharged with all pertinent information, prescriptions and personal belongings.  Patient able to teach back instructions.  IV site d/ced.  Care relinquished.

## 2021-03-25 NOTE — Progress Notes (Signed)
Pharmacy - Antimicrobial Stewardship  Per ID plan 14d of linezolid + rifampin at discharge to complete at total 4 wk course of antibiotics for MSSA bacteremia.  Discussed with patient how to take both antibiotics and side effects to monitor for (including serotonin syndrome and liver toxicity).  We discussed interaction with amphetamines,  he said he recently starting use methamphetamines but adamant that he is will not use after discharge. He is aware of significant drug interaction with antibiotics and amphetamines.  He stated he was not taking any prescription medications prior to admission.  He is aware he will have lab work done at ID f/u appt as outpatient He has medicaid - linezolid copay is $0. His [facility] pharmacy confirms they have the antibiotics in stock.    Juliette Alcide, PharmD, BCPS.   Work Cell: [phone] 03/25/2021 11:26 AM

## 2021-04-01 ENCOUNTER — Other Ambulatory Visit: Payer: Self-pay

## 2021-04-01 ENCOUNTER — Encounter: Payer: Self-pay | Admitting: Infectious Diseases

## 2021-04-01 ENCOUNTER — Other Ambulatory Visit
Admission: RE | Admit: 2021-04-01 | Discharge: 2021-04-01 | Disposition: A | Discharge status: Home / Self Care | Payer: Medicaid Other | Attending: Infectious Diseases | Admitting: Infectious Diseases

## 2021-04-01 ENCOUNTER — Ambulatory Visit: Payer: Medicaid Other | Attending: Infectious Diseases | Admitting: Infectious Diseases

## 2021-04-01 VITALS — BP 128/74 | HR 111 | Temp 98.2°F | Resp 16 | Ht 71.0 in | Wt 176.0 lb

## 2021-04-01 DIAGNOSIS — Z79899 Other long term (current) drug therapy: Secondary | ICD-10-CM | POA: Insufficient documentation

## 2021-04-01 DIAGNOSIS — R7881 Bacteremia: Secondary | ICD-10-CM | POA: Insufficient documentation

## 2021-04-01 DIAGNOSIS — L209 Atopic dermatitis, unspecified: Secondary | ICD-10-CM | POA: Insufficient documentation

## 2021-04-01 DIAGNOSIS — B9561 Methicillin susceptible Staphylococcus aureus infection as the cause of diseases classified elsewhere: Secondary | ICD-10-CM | POA: Insufficient documentation

## 2021-04-01 LAB — CBC WITH DIFFERENTIAL/PLATELET
Abs Immature Granulocytes: 0.02 10*3/uL (ref 0.00–0.07)
Basophils Absolute: 0.1 10*3/uL (ref 0.0–0.1)
Basophils Relative: 1 %
Eosinophils Absolute: 1.3 10*3/uL — ABNORMAL HIGH (ref 0.0–0.5)
Eosinophils Relative: 16 %
HCT: 41.4 % (ref 39.0–52.0)
Hemoglobin: 14.8 g/dL (ref 13.0–17.0)
Immature Granulocytes: 0 %
Lymphocytes Relative: 30 %
Lymphs Abs: 2.5 10*3/uL (ref 0.7–4.0)
MCH: 31.2 pg (ref 26.0–34.0)
MCHC: 35.7 g/dL (ref 30.0–36.0)
MCV: 87.2 fL (ref 80.0–100.0)
Monocytes Absolute: 0.5 10*3/uL (ref 0.1–1.0)
Monocytes Relative: 6 %
Neutro Abs: 3.9 10*3/uL (ref 1.7–7.7)
Neutrophils Relative %: 47 %
Platelets: 321 10*3/uL (ref 150–400)
RBC: 4.75 MIL/uL (ref 4.22–5.81)
RDW: 12.6 % (ref 11.5–15.5)
WBC: 8.3 10*3/uL (ref 4.0–10.5)
nRBC: 0 % (ref 0.0–0.2)

## 2021-04-01 LAB — COMPREHENSIVE METABOLIC PANEL
ALT: 22 U/L (ref 0–44)
AST: 23 U/L (ref 15–41)
Albumin: 4.1 g/dL (ref 3.5–5.0)
Alkaline Phosphatase: 77 U/L (ref 38–126)
Anion gap: 8 (ref 5–15)
BUN: 7 mg/dL (ref 6–20)
CO2: 28 mmol/L (ref 22–32)
Calcium: 8.8 mg/dL — ABNORMAL LOW (ref 8.9–10.3)
Chloride: 104 mmol/L (ref 98–111)
Creatinine, Ser: 0.92 mg/dL (ref 0.61–1.24)
GFR, Estimated: >60 mL/min (ref >60)
Glucose, Bld: 110 mg/dL — ABNORMAL HIGH (ref 70–99)
Potassium: 3.6 mmol/L (ref 3.5–5.1)
Sodium: 140 mmol/L (ref 135–145)
Total Bilirubin: 0.6 mg/dL (ref 0.3–1.2)
Total Protein: 6.7 g/dL (ref 6.5–8.1)

## 2021-04-01 LAB — SEDIMENTATION RATE: Sed Rate: 2 mm/hr (ref 0–15)

## 2021-04-01 MED ORDER — CEFADROXIL 500 MG PO CAPS: 500.0000 mg | ORAL_CAPSULE | Freq: Two times a day (BID) | ORAL | 0 refills | Status: AC

## 2021-04-01 NOTE — Patient Instructions (Addendum)
You are here for follow up of the recent MSSA infectionbacteremia and skin lesions- you completed 2 weeks of Iv cefazolin and was sent on PO linezolid 600mg  PO bID and Po rifampin 600mg  BID- you have been taking 300mg  of rifampin BID.  Today we will do labs. Continue the same rifampin dose 300mg  1 red cap twice a day and linezolid 1 twice a day

## 2021-04-01 NOTE — Progress Notes (Signed)
NAME: Randy Henry  DOB: 2001-06-23  MRN: [id number]  Date/Time: 04/01/2021 10:31 AM   Subjective:   ? Randy Henry is a 20 y.o. male is here for follow-up after recent hospitalization for MSSA bacteremia and MSSA skin wounds. Patient has atopic eczema and had presented with multiple wounds on his arms from scratching.  They were infected with MSSA. Patient was initially treated with 2 weeks of cefazolin.  TEE was done and initially there was a question of aortic valve vegetation versus lambl's excrescence.  This was reviewed by another cardiologist Dr.Agbor-Etang and endocarditis was ruled out.  The patient was discharged home on linezolid 600 mg p.o. twice daily along with rifampin 600 mg p.o. twice daily.  He has been only taking 300 mg twice daily but that should be adequate because there is no endocarditis.  Patient to be admitted to the medication.  He does not experience any side effects.  He is not been doing any crystal meth.  He has not taken any Zoloft. Patient says when he feels heart he can have tachycardia. Past Medical History:  Diagnosis Date   ADHD (attention deficit hyperactivity disorder)     Past Surgical History:  Procedure Laterality Date   TEE WITHOUT CARDIOVERSION N/A 03/16/2021   Procedure: TRANSESOPHAGEAL ECHOCARDIOGRAM (TEE);  Surgeon: Nelva Bush, MD;  Location: [facility] ORS;  Service: Cardiovascular;  Laterality: N/A;    Social History   Socioeconomic History   Marital status: Single    Spouse name: Not on file   Number of children: Not on file   Years of education: Not on file   Highest education level: Not on file  Occupational History   Not on file  Tobacco Use   Smoking status: Never    Passive exposure: Yes   Smokeless tobacco: Never  Substance and Sexual Activity   Alcohol use: No   Drug use: Not on file   Sexual activity: Not on file  Other Topics Concern   Not on file  Social History Narrative   Not on file   Social Determinants of  Health   Financial Resource Strain: Not on file  Food Insecurity: Not on file  Transportation Needs: Not on file  Physical Activity: Not on file  Stress: Not on file  Social Connections: Not on file  Intimate Partner Violence: Not on file    Family History  Problem Relation Age of Onset   ADD / ADHD Mother    Cancer Maternal Grandfather    Allergies  Allergen Reactions   Shellfish-Derived Products    I? Current Outpatient Medications  Medication Sig Dispense Refill   rifampin (RIFADIN) 300 MG capsule Take 2 capsules (600 mg total) by mouth 2 (two) times daily for 14 days. 56 capsule 0   triamcinolone ointment (KENALOG) 0.1 % APP EXT AA BID UNTIL CLEAR THEN PRN  1   valACYclovir (VALTREX) 500 MG tablet Take 500 mg by mouth daily.  2   linezolid (ZYVOX) 600 MG tablet Take 1 tablet (600 mg total) by mouth 2 (two) times daily for 14 days. 28 tablet 0   predniSONE (DELTASONE) 20 MG tablet Take 2 tablets (40 mg total) by mouth daily with breakfast. (Patient not taking: Reported on 04/01/2021) 6 tablet 0   sertraline (ZOLOFT) 50 MG tablet Take 1 tablet (50 mg total) by mouth daily. (Patient not taking: Reported on 04/01/2021) 30 tablet 3   No current facility-administered medications for this visit.     Abtx:  Anti-infectives (  From admission, onward)    None       REVIEW OF SYSTEMS:  Const: negative fever, negative chills, negative weight loss Eyes: negative diplopia or visual changes, negative eye pain ENT: negative coryza, negative sore throat Resp: negative cough, hemoptysis, dyspnea Cards: negative for chest pain, palpitations, lower extremity edema GU: negative for frequency, dysuria and hematuria GI: Negative for abdominal pain, diarrhea, bleeding, constipation Skin: negative for rash and pruritus Heme: negative for easy bruising and gum/nose bleeding MS: negative for myalgias, arthralgias, back pain and muscle weakness Neurolo:negative for headaches, dizziness,  vertigo, memory problems  Psych: negative for feelings of anxiety, depression  Endocrine: negative for thyroid, diabetes Allergy/Immunology-shellfish Objective:  VITALS:  BP 128/74   Pulse (!) 111   Temp 98.2 F (36.8 C) (Temporal)   Resp 16   Ht 5' 11" (1.803 m)   Wt 176 lb (79.8 kg)   SpO2 96%   BMI 24.55 kg/m  PHYSICAL EXAM:  General: Alert, cooperative, no distress, appears stated age.  Head: Normocephalic, without obvious abnormality, atraumatic. Eyes: Conjunctivae clear, anicteric sclerae. Pupils are equal ENT Nares normal. No drainage or sinus tenderness. Lips, mucosa, and tongue normal. No Thrush Neck: Supple, symmetrical, no adenopathy, thyroid: non tender no carotid bruit and no JVD. Back: No CVA tenderness. Lungs: Clear to auscultation bilaterally. No Wheezing or Rhonchi. No rales. Heart: Regular rate and rhythm, no murmur, rub or gallop. Abdomen: Soft, non-tender,not distended. Bowel sounds normal. No masses Extremities: Skin over the arms the wounds have healed but underlying eczema is present but better with prednisone Skin: As above Lymph: Cervical, supraclavicular normal. Neurologic: Grossly non-focal Pertinent Labs Lab Results CBC    Component Value Date/Time   WBC 10.0 03/25/2021 0456   RBC 5.14 03/25/2021 0456   HGB 15.7 03/25/2021 0456   HCT 43.4 03/25/2021 0456   PLT 334 03/25/2021 0456   MCV 84.4 03/25/2021 0456   MCH 30.5 03/25/2021 0456   MCHC 36.2 (H) 03/25/2021 0456   RDW 12.2 03/25/2021 0456   LYMPHSABS 2.4 03/25/2021 0456   MONOABS 0.6 03/25/2021 0456   EOSABS 1.0 (H) 03/25/2021 0456   BASOSABS 0.1 03/25/2021 0456    CMP Latest Ref Rng & Units 03/25/2021 03/18/2021 03/13/2021  Glucose 70 - 99 mg/dL 105(H) 62(L) 84  BUN 6 - 20 mg/dL 15 11 5(L)  Creatinine 0.61 - 1.24 mg/dL 0.86 0.90 0.99  Sodium 135 - 145 mmol/L 138 140 139  Potassium 3.5 - 5.1 mmol/L 3.9 3.7 3.6  Chloride 98 - 111 mmol/L 100 103 102  CO2 22 - 32 mmol/L 27 31 30   Calcium 8.9 - 10.3 mg/dL 9.2 9.0 8.7(L)  Total Protein 6.5 - 8.1 g/dL 7.4 7.1 -  Total Bilirubin 0.3 - 1.2 mg/dL 0.6 0.4 -  Alkaline Phos 38 - 126 U/L 70 69 -  AST 15 - 41 U/L 36 28 -  ALT 0 - 44 U/L 34 33 -    Impression/Recommendation ? MSSA bacteremia secondary to skin and soft tissue infection with cellulitis of arms.  Repeat blood culture was negative.  2D echo was negative.  TEE initially was reported as samples excretions versus aortic endocarditis.  Dr. And asked the TEE to be reviewed by Dr. Fortier thank no endocarditis was seen.  Only lamberts excrescence was present.  So it was decided to treat him with 2 weeks of IV cefazolin and 2 more weeks of linezolid plus rifampin.  He has been taking 300 mg twice daily of rifampin   instead of 600 twice daily.  He will complete another week of the linezolid plus rifampin.  Then after that may go on cefadroxil.  Labs were done today.  Creatinine 0.92, WBC 8.3, Hb 14.8, platelet 321.  LFTs are normal.  ESR 2.  Glucose 110.  Atopic eczema he has completed 5 days of prednisone. He will follow-up with dermatology. Discussed with patient in great detail.  We will see him in 4 weeks. ___________________________________________________  Note:  This document was prepared using Dragon voice recognition software and may include unintentional dictation errors.  

## 2021-04-06 ENCOUNTER — Telehealth: Payer: Self-pay

## 2021-04-06 LAB — PANEL 799049
CARBOXY THC GC/MS CONF: 163 ng/mL
Cannabinoid GC/MS, Ur: POSITIVE — AB

## 2021-04-06 LAB — URINE DRUGS OF ABUSE SCREEN W ALC, ROUTINE (REF LAB)
Amphetamines, Urine: NEGATIVE ng/mL
Barbiturate, Ur: NEGATIVE ng/mL
Benzodiazepine Quant, Ur: NEGATIVE ng/mL
Ethanol U, Quan: NEGATIVE %
Methadone Screen, Urine: NEGATIVE ng/mL
Opiate Quant, Ur: NEGATIVE ng/mL
Phencyclidine, Ur: NEGATIVE ng/mL
Propoxyphene, Urine: NEGATIVE ng/mL

## 2021-04-06 LAB — COCAINE CONF, UR
Benzoylecgonine GC/MS Conf: 4680 ng/mL
Cocaine Metab Quant, Ur: POSITIVE — AB

## 2021-04-06 LAB — CULTURE, BLOOD (SINGLE)
Culture: NO GROWTH
Culture: NO GROWTH
Special Requests: ADEQUATE
Special Requests: ADEQUATE

## 2021-04-06 NOTE — Telephone Encounter (Signed)
Left VM to call [facility] ID as we have some labs to review and a few changes coming up. Patient will change to Cefadroxil on 04/09/21. Need to see how he is doing on Rifampin and Linzolid combp.PLease advise his labs look good.

## 2021-04-07 NOTE — Telephone Encounter (Signed)
Spoke to mother Trudie Buckler) who is listed in [place] and as contact in chart. We went over lab results and I advised that he is switching to Cefadroxil today and hopefully this will calm things down. I advised her to call me back in a few days to see how things are going. I have sent this information as an FYI to Dr Sampson Goon who is covering for DR Ravishankar.

## 2021-04-22 ENCOUNTER — Ambulatory Visit: Payer: Medicaid Other | Admitting: Infectious Diseases

## 2021-08-19 ENCOUNTER — Encounter: Payer: Self-pay | Admitting: Family

## 2021-09-18 ENCOUNTER — Other Ambulatory Visit: Payer: Self-pay | Admitting: Pediatrics

## 2021-10-08 IMAGING — CR DG CHEST 2V
1 series · 2 of 2 positions shown · non-contrast
Comparison: 09/22/2010

CLINICAL DATA: Sepsis COVID

EXAM:
CHEST - 2 VIEW

[Series 1: dg chest 2 view · 0.14mm/px · 2 of 2 slices shown]
[im 1/2]
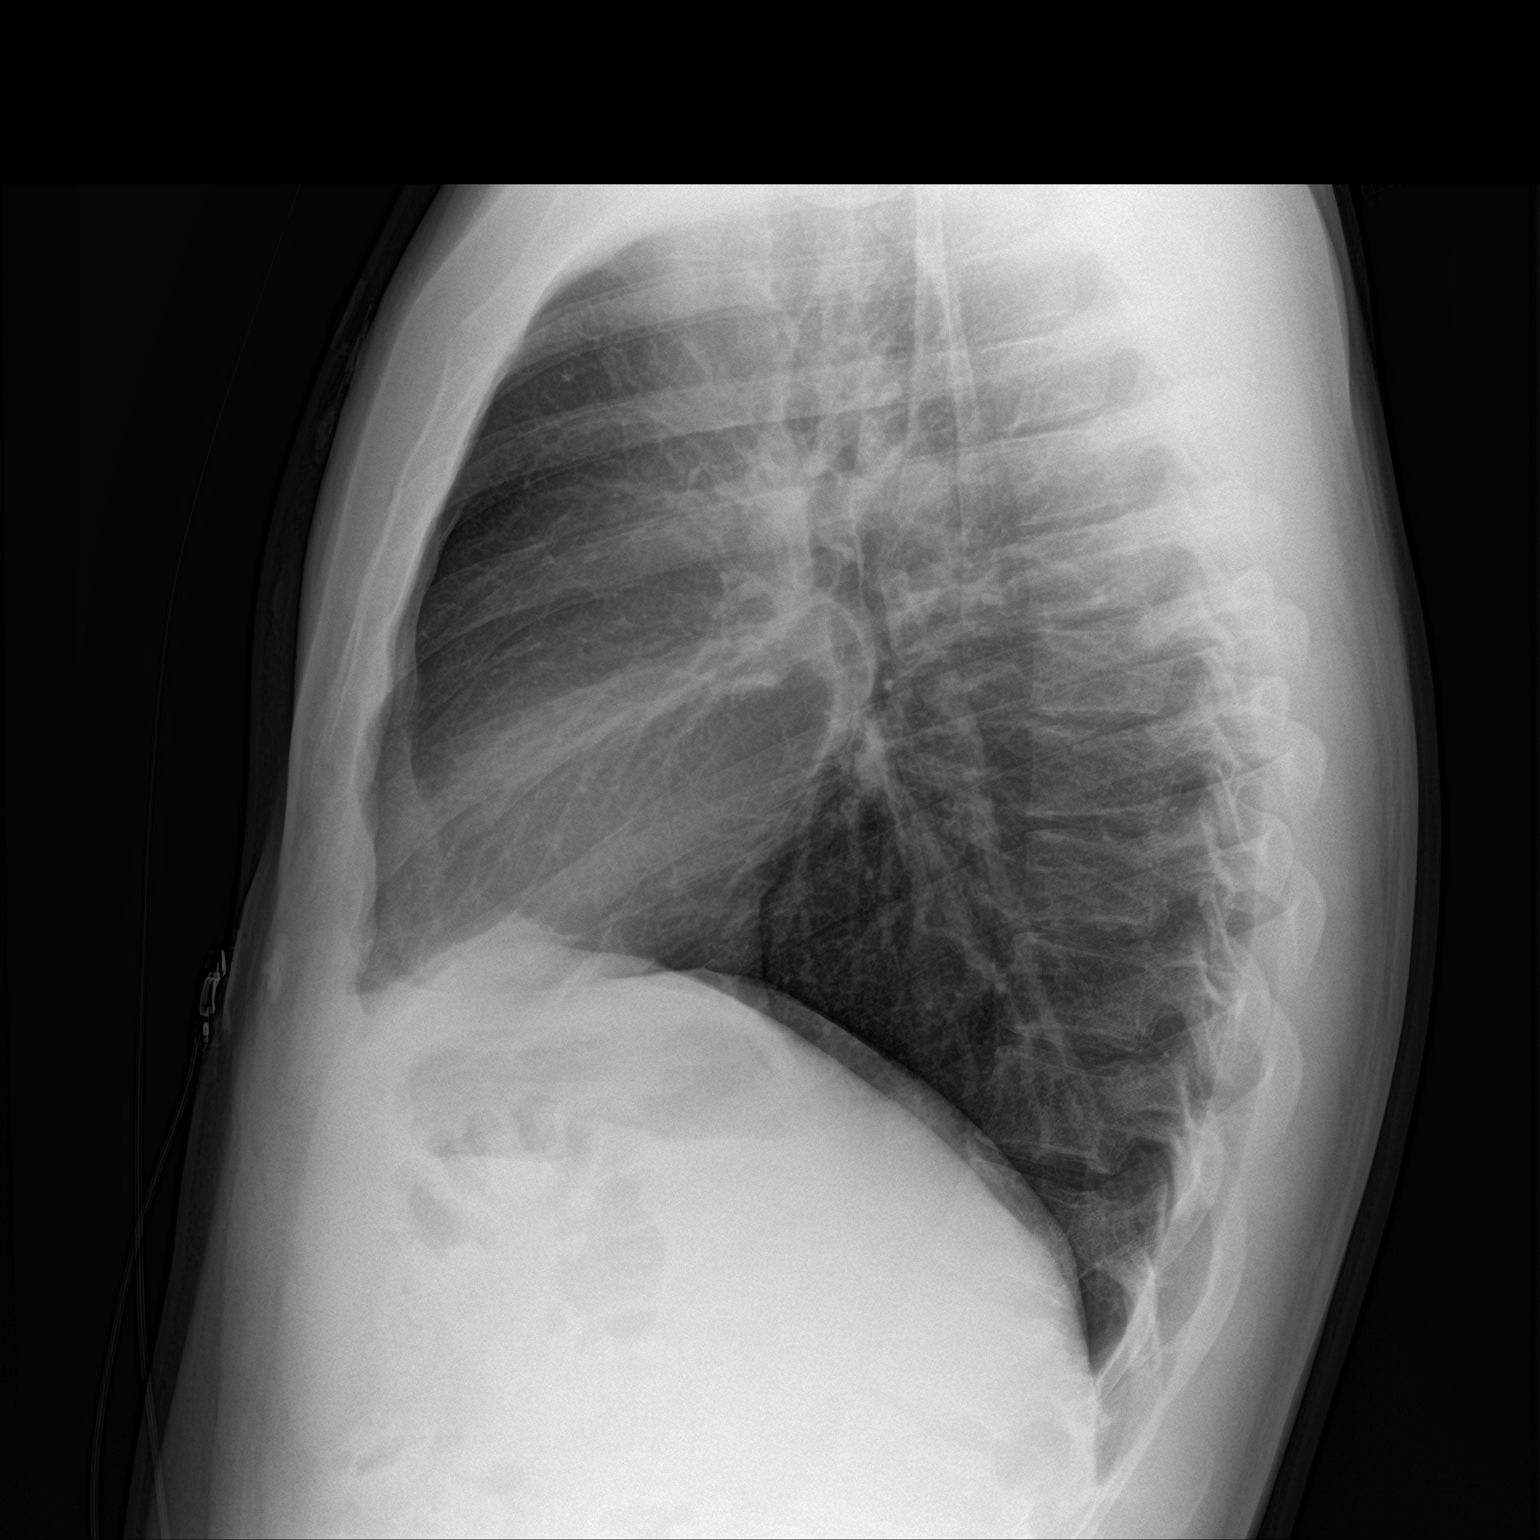
[im 2/2]
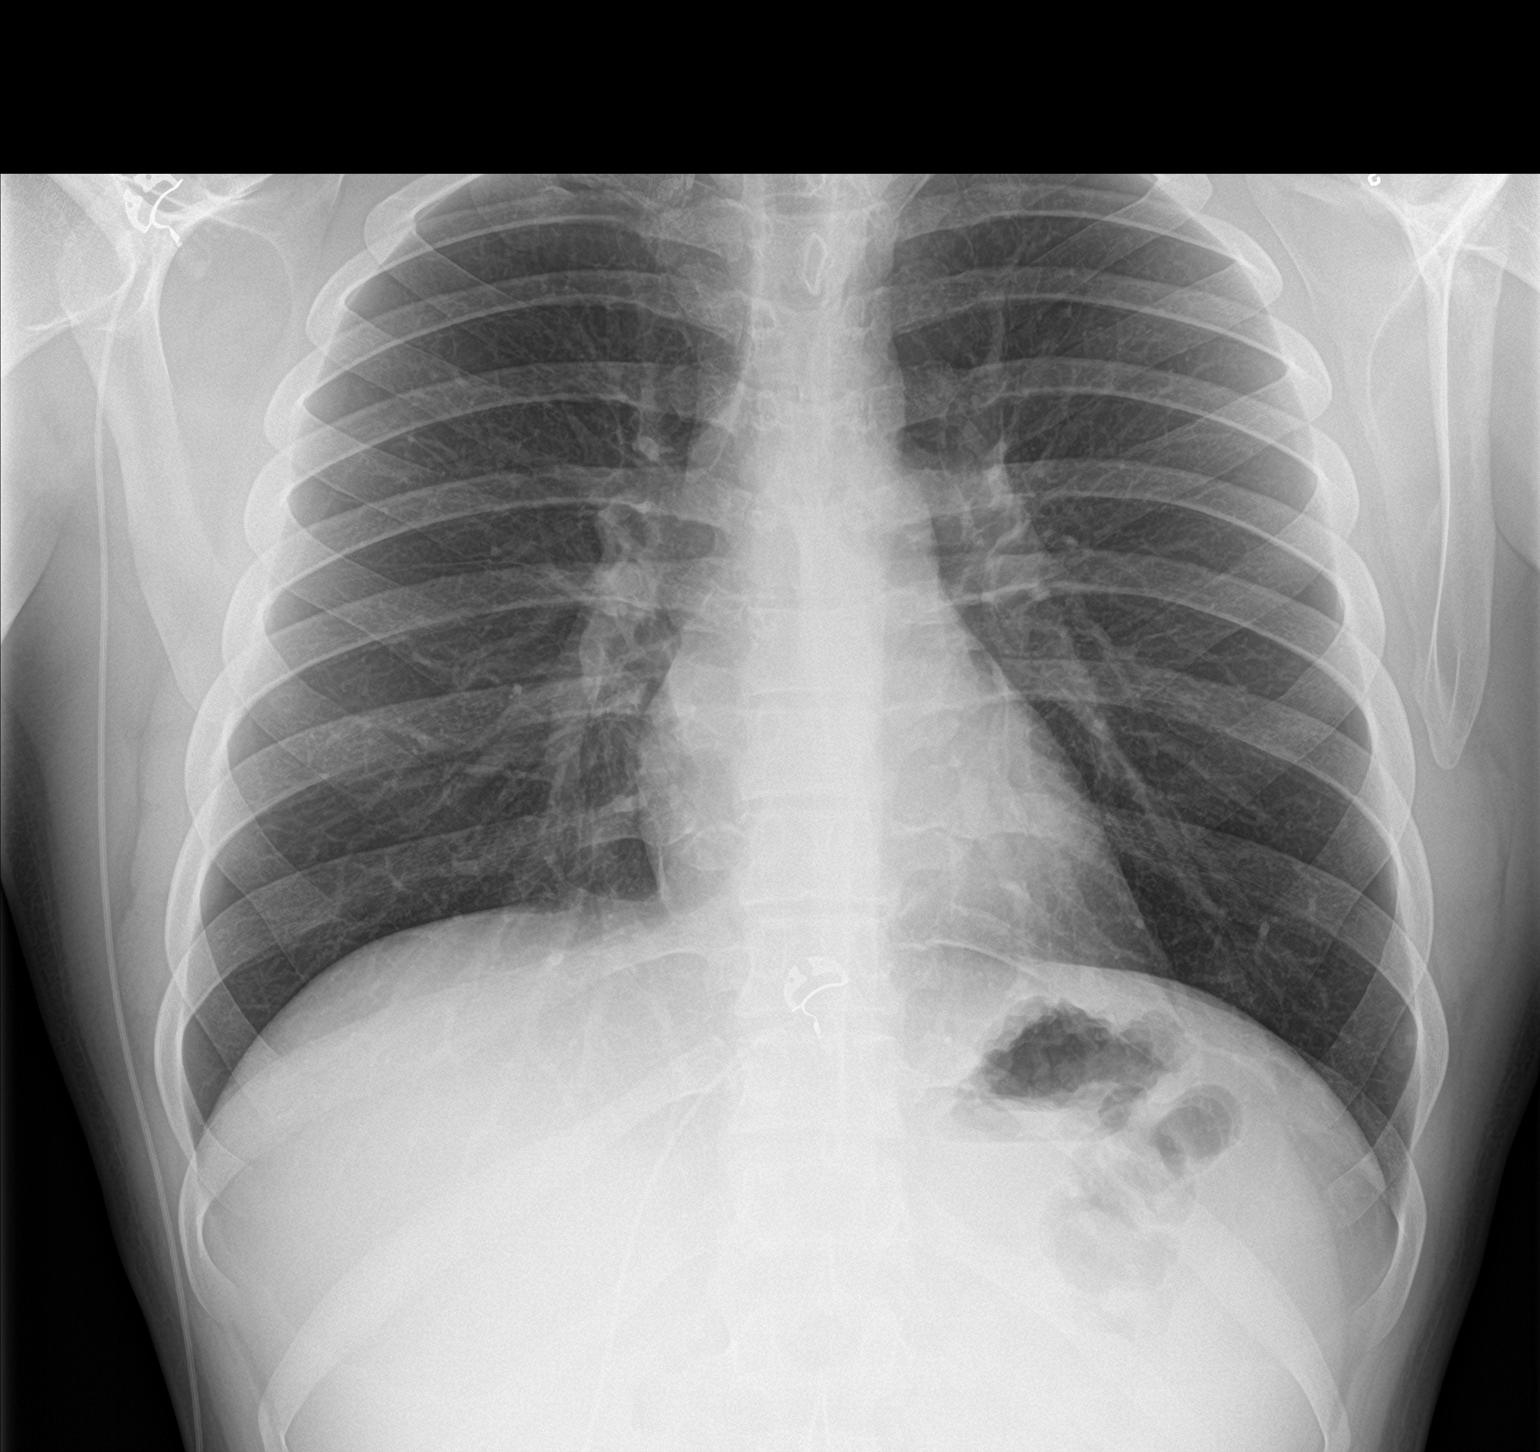

[2 of 2 positions shown; findings below may reference images not displayed]

FINDINGS: The heart size and mediastinal contours are within normal limits.
Both lungs are clear. The visualized skeletal structures are
unremarkable.
IMPRESSION: No active cardiopulmonary disease.

## 2022-06-30 DIAGNOSIS — J069 Acute upper respiratory infection, unspecified: Secondary | ICD-10-CM | POA: Diagnosis not present

## 2022-08-31 DIAGNOSIS — L2089 Other atopic dermatitis: Secondary | ICD-10-CM | POA: Diagnosis not present

## 2022-09-04 DIAGNOSIS — Z6833 Body mass index (BMI) 33.0-33.9, adult: Secondary | ICD-10-CM | POA: Diagnosis not present

## 2022-09-04 DIAGNOSIS — H1031 Unspecified acute conjunctivitis, right eye: Secondary | ICD-10-CM | POA: Diagnosis not present

## 2022-09-05 DIAGNOSIS — B0052 Herpesviral keratitis: Secondary | ICD-10-CM | POA: Diagnosis not present

## 2022-09-08 DIAGNOSIS — B0052 Herpesviral keratitis: Secondary | ICD-10-CM | POA: Diagnosis not present

## 2022-09-15 DIAGNOSIS — B0052 Herpesviral keratitis: Secondary | ICD-10-CM | POA: Diagnosis not present

## 2022-09-22 DIAGNOSIS — B0052 Herpesviral keratitis: Secondary | ICD-10-CM | POA: Diagnosis not present

## 2022-10-13 DIAGNOSIS — B0052 Herpesviral keratitis: Secondary | ICD-10-CM | POA: Diagnosis not present

## 2022-11-10 ENCOUNTER — Encounter (HOSPITAL_COMMUNITY): Payer: Self-pay | Admitting: *Deleted

## 2022-11-10 ENCOUNTER — Emergency Department (HOSPITAL_COMMUNITY): Payer: 59

## 2022-11-10 ENCOUNTER — Other Ambulatory Visit: Payer: Self-pay

## 2022-11-10 ENCOUNTER — Emergency Department (HOSPITAL_COMMUNITY)
Admission: EM | Admit: 2022-11-10 | Discharge: 2022-11-10 | Disposition: A | Discharge status: Home / Self Care | Payer: 59 | Attending: Emergency Medicine | Admitting: Emergency Medicine

## 2022-11-10 DIAGNOSIS — M545 Low back pain, unspecified: Secondary | ICD-10-CM

## 2022-11-10 DIAGNOSIS — S3992XA Unspecified injury of lower back, initial encounter: Secondary | ICD-10-CM | POA: Diagnosis not present

## 2022-11-10 DIAGNOSIS — M4316 Spondylolisthesis, lumbar region: Secondary | ICD-10-CM | POA: Diagnosis not present

## 2022-11-10 DIAGNOSIS — H5702 Anisocoria: Secondary | ICD-10-CM | POA: Diagnosis not present

## 2022-11-10 DIAGNOSIS — M546 Pain in thoracic spine: Secondary | ICD-10-CM | POA: Diagnosis not present

## 2022-11-10 MED ORDER — BACLOFEN 10 MG PO TABS: 10.0000 mg | ORAL_TABLET | Freq: Three times a day (TID) | ORAL | 0 refills | Status: DC

## 2022-11-10 MED ORDER — HYDROCODONE-ACETAMINOPHEN 5-325 MG PO TABS: 1.0000 | ORAL_TABLET | Freq: Four times a day (QID) | ORAL | 0 refills | Status: AC | PRN

## 2022-11-10 MED ORDER — KETOROLAC TROMETHAMINE 30 MG/ML IJ SOLN
30.0000 mg | Freq: Once | INTRAMUSCULAR | Status: AC
  Administered 2022-11-10: 30 mg via INTRAVENOUS
  Filled 2022-11-10: qty 1

## 2022-11-10 MED ORDER — OXYCODONE-ACETAMINOPHEN 5-325 MG PO TABS
2.0000 | ORAL_TABLET | Freq: Once | ORAL | Status: AC
  Administered 2022-11-10: 2 via ORAL
  Filled 2022-11-10: qty 2

## 2022-11-10 MED ORDER — BACLOFEN 10 MG PO TABS: 10.0000 mg | ORAL_TABLET | Freq: Three times a day (TID) | ORAL | 0 refills | Status: AC

## 2022-11-10 MED ORDER — HYDROCODONE-ACETAMINOPHEN 5-325 MG PO TABS: 1.0000 | ORAL_TABLET | Freq: Four times a day (QID) | ORAL | 0 refills | Status: DC | PRN

## 2022-11-10 MED ORDER — FENTANYL CITRATE PF 50 MCG/ML IJ SOSY
50.0000 ug | PREFILLED_SYRINGE | Freq: Once | INTRAMUSCULAR | Status: AC
  Administered 2022-11-10: 50 ug via INTRAVENOUS
  Filled 2022-11-10: qty 1

## 2022-11-10 MED ORDER — MELOXICAM 7.5 MG PO TABS: 7.5000 mg | ORAL_TABLET | Freq: Every day | ORAL | 0 refills | Status: AC

## 2022-11-10 MED ORDER — MELOXICAM 7.5 MG PO TABS: 7.5000 mg | ORAL_TABLET | Freq: Every day | ORAL | 0 refills | Status: DC

## 2022-11-10 NOTE — ED Provider Notes (Signed)
[facility] Provider Note   CSN: [id number] Arrival date & time: 11/10/22  1041     History  Chief Complaint  Patient presents with   Back Pain    Randy Henry is a 22 y.o. male presents to the ED complaining of back pain that radiates to his right shoulder and neck pain.  Patient states he was getting out of the car yesterday when he felt a sudden "pop" in his back.  He states he was at work trying to lift something when he had a sharp pain from the lower back to the right shoulder. He is also now having neck pain.  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  Denies numbness or tingling, weakness, loss of bladder of bowel control, headache, dizziness, lightheadedness, syncope, visual disturbance, fever.  Denies drug or alcohol use.         Home Medications Prior to Admission medications   Medication Sig Start Date End Date Taking? Authorizing Provider  baclofen (LIORESAL) 10 MG tablet Take 1 tablet (10 mg total) by mouth 3 (three) times daily. 11/10/22  Yes Mayer Vondrak R, PA-C  HYDROcodone-acetaminophen (NORCO/VICODIN) 5-325 MG tablet Take 1-2 tablets by mouth every 6 (six) hours as needed for severe pain. 11/10/22  Yes Cash Meadow R, PA-C  meloxicam (MOBIC) 7.5 MG tablet Take 1 tablet (7.5 mg total) by mouth daily. 11/10/22  Yes Vince Ainsley R, PA-C  cefadroxil (DURICEF) 500 MG capsule Take 1 capsule (500 mg total) by mouth 2 (two) times daily. 04/09/21   Lynn Ito, MD  predniSONE (DELTASONE) 20 MG tablet Take 2 tablets (40 mg total) by mouth daily with breakfast. Patient not taking: Reported on 04/01/2021 03/25/21   Alberteen Sam, MD  sertraline (ZOLOFT) 50 MG tablet Take 1 tablet (50 mg total) by mouth daily. Patient not taking: Reported on 04/01/2021 03/25/21   Alberteen Sam, MD  triamcinolone ointment (KENALOG) 0.1 % APP EXT AA BID UNTIL CLEAR THEN PRN 02/13/18   [provider]  valACYclovir (VALTREX) 500 MG tablet Take 500 mg by mouth daily. 11/10/16   [provider]  levocetirizine (XYZAL) 5 MG tablet TAKE 1 TABLET PO EVERY EVENING Patient not taking: No sig reported 11/21/17 07/08/20  [provider]      Allergies    Shellfish-derived products    Review of Systems   Review of Systems  Constitutional:  Negative for fever.  Eyes:  Negative for visual disturbance.  Musculoskeletal:  Positive for back pain and neck pain.  Neurological:  Negative for dizziness, weakness, light-headedness, numbness and headaches.    Physical Exam Updated Vital Signs BP (!) 129/97   Pulse 99   Temp 98.7 F (37.1 C)   Resp 18   SpO2 98%  Physical Exam Vitals and nursing note reviewed.  Constitutional:      General: He is not in acute distress.    Appearance: Normal appearance. He is not ill-appearing or diaphoretic.  HENT:     Head: Normocephalic and atraumatic.  Eyes:     General: Vision grossly intact.     Extraocular Movements: Extraocular movements intact.     Conjunctiva/sclera: Conjunctivae normal.     Pupils: Pupils are unequal (left pupil 7mm, right pupil 5 mm).     Right eye: Pupil is round, reactive and not sluggish.     Left eye: Pupil is round, reactive and not sluggish.  Cardiovascular:  Rate and Rhythm: Normal rate and regular rhythm.  Pulmonary:     Effort: Pulmonary effort is normal.  Musculoskeletal:     Cervical back: Pain with movement and muscular tenderness present. No spinous process tenderness. Normal range of motion.     Thoracic back: Tenderness and bony tenderness present. No deformity, signs of trauma or spasms. Decreased range of motion.     Lumbar back: Tenderness and bony tenderness present. Decreased range of motion. Negative right straight leg raise test and negative left straight leg raise test.  Neurological:     General: No focal deficit present.     Mental Status: He is alert and oriented to  person, place, and time. Mental status is at baseline.     Cranial Nerves: Cranial nerves 2-12 are intact.     Sensory: Sensation is intact.     Motor: Motor function is intact.     Coordination: Coordination is intact.     Gait: Gait is intact.     Comments: CN 2-12 grossly intact.  Patient moves all extremities appropriately.  5/5 strength in BUE and BLE with normal sensation.   Psychiatric:        Mood and Affect: Mood normal.        Behavior: Behavior normal.     ED Results / Procedures / Treatments   Labs (all labs ordered are listed, but only abnormal results are displayed) Labs Reviewed - No data to display  EKG None  Radiology CT Lumbar Spine Wo Contrast  Result Date: 11/10/2022 CLINICAL DATA:  Provided history: Back trauma, no prior imaging. Additional history provided: Patient reports sharp pain from bottom of spine to right shoulder. EXAM: CT THORACIC AND LUMBAR SPINE WITHOUT CONTRAST TECHNIQUE: Multidetector CT imaging of the thoracic and lumbar spine was performed without contrast. Multiplanar CT image reconstructions were also generated. RADIATION DOSE REDUCTION: This exam was performed according to the departmental dose-optimization program which includes automated exposure control, adjustment of the mA and/or kV according to patient size and/or use of iterative reconstruction technique. COMPARISON:  None. FINDINGS: CT THORACIC SPINE FINDINGS Alignment: No significant spondylolisthesis. Vertebrae: Vertebral body height is maintained. No evidence of acute fracture to the thoracic spine. Paraspinal and other soft tissues: No acute finding within included portions of the thorax or upper abdomen/retroperitoneum. No paraspinal mass or hematoma/collection. Disc levels: The intervertebral disc spaces are preserved within the thoracic spine. No significant disc herniation or spinal canal stenosis is appreciated. No significant bony neural foraminal narrowing. CT LUMBAR SPINE FINDINGS  Segmentation: 5 lumbar vertebrae. The caudal most well-formed intervertebral disc space is designated L5-S1. Alignment: Slight L4-L5 grade 1 anterolisthesis. Vertebrae: Vertebral body height is maintained. No evidence of acute fracture to the lumbar spine. Paraspinal and other soft tissues: No acute finding within included portions of the abdomen/retroperitoneum. No paraspinal mass or hematoma/collection. Disc levels: The anterior cerebral disc spaces are preserved. Small disc bulges at L3-L4 and L4-L5. No significant spinal canal or foraminal stenosis appreciated. IMPRESSION: Thoracic spine: No evidence of acute fracture to the thoracic spine. Lumbar spine: 1. No evidence of acute fracture to the thoracic spine. 2. Slight L4-L5 grade 1 anterolisthesis. 3. Small disc bulges at L3-L4 and L4-L5. Electronically Signed   By: Jackey Loge D.O.   On: 11/10/2022 14:34   CT Thoracic Spine Wo Contrast  Result Date: 11/10/2022 CLINICAL DATA:  Provided history: Back trauma, no prior imaging. Additional history provided: Patient reports sharp pain from bottom of spine to right shoulder. EXAM: CT  THORACIC AND LUMBAR SPINE WITHOUT CONTRAST TECHNIQUE: Multidetector CT imaging of the thoracic and lumbar spine was performed without contrast. Multiplanar CT image reconstructions were also generated. RADIATION DOSE REDUCTION: This exam was performed according to the departmental dose-optimization program which includes automated exposure control, adjustment of the mA and/or kV according to patient size and/or use of iterative reconstruction technique. COMPARISON:  None. FINDINGS: CT THORACIC SPINE FINDINGS Alignment: No significant spondylolisthesis. Vertebrae: Vertebral body height is maintained. No evidence of acute fracture to the thoracic spine. Paraspinal and other soft tissues: No acute finding within included portions of the thorax or upper abdomen/retroperitoneum. No paraspinal mass or hematoma/collection. Disc levels: The  intervertebral disc spaces are preserved within the thoracic spine. No significant disc herniation or spinal canal stenosis is appreciated. No significant bony neural foraminal narrowing. CT LUMBAR SPINE FINDINGS Segmentation: 5 lumbar vertebrae. The caudal most well-formed intervertebral disc space is designated L5-S1. Alignment: Slight L4-L5 grade 1 anterolisthesis. Vertebrae: Vertebral body height is maintained. No evidence of acute fracture to the lumbar spine. Paraspinal and other soft tissues: No acute finding within included portions of the abdomen/retroperitoneum. No paraspinal mass or hematoma/collection. Disc levels: The anterior cerebral disc spaces are preserved. Small disc bulges at L3-L4 and L4-L5. No significant spinal canal or foraminal stenosis appreciated. IMPRESSION: Thoracic spine: No evidence of acute fracture to the thoracic spine. Lumbar spine: 1. No evidence of acute fracture to the thoracic spine. 2. Slight L4-L5 grade 1 anterolisthesis. 3. Small disc bulges at L3-L4 and L4-L5. Electronically Signed   By: Jackey Loge D.O.   On: 11/10/2022 14:34   CT Head Wo Contrast  Result Date: 11/10/2022 CLINICAL DATA:  Anisocoria EXAM: CT HEAD WITHOUT CONTRAST TECHNIQUE: Contiguous axial images were obtained from the base of the skull through the vertex without intravenous contrast. RADIATION DOSE REDUCTION: This exam was performed according to the departmental dose-optimization program which includes automated exposure control, adjustment of the mA and/or kV according to patient size and/or use of iterative reconstruction technique. COMPARISON:  09/06/2019 FINDINGS: Brain: No evidence of acute infarction, hemorrhage, mass, mass effect, or midline shift. No hydrocephalus. Redemonstrated expansion of the CSF filled space below the tentorium and superior to the cerebellum, favored to represent an arachnoid cyst, unchanged. Vascular: No hyperdense vessel. Skull: Negative for fracture or focal lesion.  Sinuses/Orbits: No acute finding. Other: The mastoid air cells are well aerated. IMPRESSION: No acute intracranial process. Electronically Signed   By: Wiliam Ke M.D.   On: 11/10/2022 14:18    Procedures Procedures    Medications Ordered in ED Medications  oxyCODONE-acetaminophen (PERCOCET/ROXICET) 5-325 MG per tablet 2 tablet (has no administration in time range)  ketorolac (TORADOL) 30 MG/ML injection 30 mg (has no administration in time range)  fentaNYL (SUBLIMAZE) injection 50 mcg (50 mcg Intravenous Given 11/10/22 1322)    ED Course/ Medical Decision Making/ A&P                             Medical Decision Making Amount and/or Complexity of Data Reviewed Radiology: ordered.  Risk Prescription drug management.   This patient presents to the ED with chief complaint(s) of back pain, neck pain with non-contributory past medical history.  The complaint involves an extensive differential diagnosis and also carries with it a high risk of complications and morbidity.    The differential diagnosis includes herniated disc, spondylolisthesis, traumatic fracture, intraarticular arthritis   The initial plan is to obtain CT imaging  Initial Assessment:   Exam significant for tenderness to palpation of thoracic and lumbar spine beginning around T11-T12 and extending down to L5-S1.  No gross deformities or abnormal curvature of the spine.  Patient is moving all extremities appropriately.    Independent visualization and interpretation of imaging: I independently visualized the following imaging with scope of interpretation limited to determining acute life threatening conditions related to emergency care: CT head, thoracic and lumbar spine, which revealed no evidence of acute intracranial abnormality, acute vertebral fracture.  Slight L4-L5 grade 1 anterolisthesis and small disc bulges at L3-L4 and L4-L5.    Treatment and Reassessment: Patient given IV fentanyl without improvement of  symptoms.  Will give IV toradol and oral oxycodone.  Patient's pain likely related to mild anterolisthesis and disc bulges.    Disposition:   Will send patient home on muscle relaxants, anti-inflammatory, and pain medication.  Will refer patient to orthopedics for spine follow-up and ophthalmology due to anisocoria.  CT head is reassuring for no acute intracranial process causing this.     The patient has been appropriately medically screened and/or stabilized in the ED. I have low suspicion for any other emergent medical condition which would require further screening, evaluation or treatment in the ED or require inpatient management. At time of discharge the patient is hemodynamically stable and in no acute distress. I have discussed work-up results and diagnosis with patient and answered all questions. Patient is agreeable with discharge plan. We discussed strict return precautions for returning to the emergency department and they verbalized understanding.            Final Clinical Impression(s) / ED Diagnoses Final diagnoses:  Anterolisthesis of lumbar spine  Anisocoria  Acute midline low back pain without sciatica    Rx / DC Orders ED Discharge Orders          Ordered    HYDROcodone-acetaminophen (NORCO/VICODIN) 5-325 MG tablet  Every 6 hours PRN        11/10/22 1547    baclofen (LIORESAL) 10 MG tablet  3 times daily        11/10/22 1547    meloxicam (MOBIC) 7.5 MG tablet  Daily        11/10/22 1547              Lenard Simmer, PA-C 11/10/22 1552    Rexford Maus, DO 11/10/22 1555

## 2022-11-10 NOTE — Discharge Instructions (Addendum)
Thank you for allowing me to be a part of your care today.   Your CT imaging of your spine showed mild anterolisthesis and mild disc bulges in your lumbar spine.  This is forward slippage of your vertebrae which can cause pain along with disc bulges.  Your head CT and thoracic spine CT showed no acute abnormalities.    I am sending you home on medication to help manage your symptoms.  These medications include pain medicine, muscle relaxant, and anti-inflammatory.  Please follow-up with orthopedics.  I have provided [facility] information.   Your pupils today were also unequal and you will need to follow-up with ophthalmology.  If you do not have an established ophthalmologist, you may contact the [facility].  I have provided their information as well.   Return to the ED if you develop weakness or numbness in your legs, inability to walk, loss of bladder or bowel control, urinary retention, or if you have any new concerns.    I also recommend not working for the next several days and allowing your back to rest and improve.  I have provided a work note.

## 2022-11-10 NOTE — ED Triage Notes (Signed)
Back popped when getting out of vehicle last pm.  Pt states that today he has sharp pain from "bottom of spine to right shoulder"

## 2022-11-10 NOTE — ED Notes (Signed)
Pt reports back pain since yesterday when back "popped." He feels like there is a "mass" going from his back to right shoulder.

## 2022-11-17 DIAGNOSIS — H5702 Anisocoria: Secondary | ICD-10-CM | POA: Diagnosis not present

## 2023-05-11 DIAGNOSIS — L2089 Other atopic dermatitis: Secondary | ICD-10-CM | POA: Diagnosis not present

## 2023-06-06 ENCOUNTER — Other Ambulatory Visit: Payer: Self-pay | Admitting: Internal Medicine

## 2023-06-06 DIAGNOSIS — R7989 Other specified abnormal findings of blood chemistry: Secondary | ICD-10-CM

## 2023-06-13 ENCOUNTER — Ambulatory Visit
Admission: RE | Admit: 2023-06-13 | Discharge: 2023-06-13 | Disposition: A | Discharge status: Home / Self Care | Payer: Managed Care, Other (non HMO) | Source: Ambulatory Visit | Attending: Internal Medicine | Admitting: Internal Medicine

## 2023-06-13 DIAGNOSIS — R7989 Other specified abnormal findings of blood chemistry: Secondary | ICD-10-CM

## 2023-06-19 DIAGNOSIS — L2089 Other atopic dermatitis: Secondary | ICD-10-CM | POA: Diagnosis not present

## 2023-06-19 DIAGNOSIS — K769 Liver disease, unspecified: Secondary | ICD-10-CM | POA: Diagnosis not present

## 2023-07-28 DIAGNOSIS — F1911 Other psychoactive substance abuse, in remission: Secondary | ICD-10-CM | POA: Diagnosis not present

## 2023-07-28 DIAGNOSIS — L309 Dermatitis, unspecified: Secondary | ICD-10-CM | POA: Diagnosis not present

## 2023-07-28 DIAGNOSIS — K760 Fatty (change of) liver, not elsewhere classified: Secondary | ICD-10-CM | POA: Diagnosis not present

## 2023-07-28 DIAGNOSIS — R7989 Other specified abnormal findings of blood chemistry: Secondary | ICD-10-CM | POA: Diagnosis not present

## 2023-07-28 DIAGNOSIS — F902 Attention-deficit hyperactivity disorder, combined type: Secondary | ICD-10-CM | POA: Diagnosis not present

## 2023-11-23 ENCOUNTER — Other Ambulatory Visit (HOSPITAL_COMMUNITY): Payer: Self-pay

## 2023-11-23 MED ORDER — VALACYCLOVIR HCL 500 MG PO TABS
500.0000 mg | ORAL_TABLET | Freq: Two times a day (BID) | ORAL | 0 refills | Status: DC
  Filled 2023-11-23: qty 30, 15d supply, fill #0

## 2023-11-23 MED ORDER — TRIFLURIDINE 1 % OP SOLN
1.0000 [drp] | Freq: Every day | OPHTHALMIC | 0 refills | Status: AC | PRN
  Filled 2023-11-23: qty 7.5, 32d supply, fill #0

## 2023-11-24 ENCOUNTER — Other Ambulatory Visit (HOSPITAL_COMMUNITY): Payer: Self-pay

## 2023-11-27 ENCOUNTER — Other Ambulatory Visit (HOSPITAL_COMMUNITY): Payer: Self-pay

## 2023-11-30 ENCOUNTER — Other Ambulatory Visit (HOSPITAL_COMMUNITY): Payer: Self-pay

## 2023-11-30 MED ORDER — VALACYCLOVIR HCL 500 MG PO TABS: 500.0000 mg | ORAL_TABLET | Freq: Three times a day (TID) | ORAL | 1 refills | Status: AC

## 2023-12-11 ENCOUNTER — Other Ambulatory Visit (HOSPITAL_COMMUNITY): Payer: Self-pay

## 2023-12-11 MED ORDER — MOXIFLOXACIN HCL 0.5 % OP SOLN
OPHTHALMIC | 0 refills | Status: AC
  Filled 2023-12-11: qty 3, 10d supply, fill #0

## 2024-01-09 ENCOUNTER — Other Ambulatory Visit (HOSPITAL_COMMUNITY): Payer: Self-pay

## 2024-01-09 MED ORDER — VALACYCLOVIR HCL 500 MG PO TABS: 500.0000 mg | ORAL_TABLET | Freq: Every day | ORAL | 1 refills | Status: AC

## 2024-01-09 MED ORDER — VALACYCLOVIR HCL 500 MG PO TABS: 500.0000 mg | ORAL_TABLET | Freq: Three times a day (TID) | ORAL | 1 refills | Status: AC

## 2024-01-09 MED ORDER — VALACYCLOVIR HCL 500 MG PO TABS
500.0000 mg | ORAL_TABLET | Freq: Every day | ORAL | 0 refills | Status: AC
  Filled 2024-01-09: qty 90, 90d supply, fill #0

## 2024-04-10 ENCOUNTER — Other Ambulatory Visit (HOSPITAL_COMMUNITY): Payer: Self-pay

## 2024-04-10 MED ORDER — ALBUTEROL SULFATE HFA 108 (90 BASE) MCG/ACT IN AERS
2.0000 | INHALATION_SPRAY | RESPIRATORY_TRACT | 0 refills | Status: AC | PRN
  Filled 2024-04-10: qty 6.7, 17d supply, fill #0

## 2024-04-10 MED ORDER — AZITHROMYCIN 250 MG PO TABS
ORAL_TABLET | ORAL | 0 refills | Status: AC
  Filled 2024-04-10: qty 6, 5d supply, fill #0

## 2024-06-07 ENCOUNTER — Other Ambulatory Visit: Payer: Self-pay

## 2024-06-07 DIAGNOSIS — R7989 Other specified abnormal findings of blood chemistry: Secondary | ICD-10-CM

## 2024-06-07 DIAGNOSIS — K760 Fatty (change of) liver, not elsewhere classified: Secondary | ICD-10-CM

## 2024-06-10 ENCOUNTER — Inpatient Hospital Stay: Admission: RE | Admit: 2024-06-10 | Discharge: 2024-06-10 | Disposition: A | Source: Ambulatory Visit

## 2024-06-10 DIAGNOSIS — K760 Fatty (change of) liver, not elsewhere classified: Secondary | ICD-10-CM

## 2024-06-10 DIAGNOSIS — R7989 Other specified abnormal findings of blood chemistry: Secondary | ICD-10-CM

## 2024-06-10 MED ORDER — IOPAMIDOL (ISOVUE-300) INJECTION 61%
100.0000 mL | Freq: Once | INTRAVENOUS | Status: AC | PRN
  Administered 2024-06-10: 09:00:00 100 mL via INTRAVENOUS
